# Patient Record
Sex: Male | Born: 1975
Health system: Southern US, Community
[De-identification: ages and names within clinical notes are randomized; demographics above are authoritative.]

## PROBLEM LIST (undated history)

## (undated) DIAGNOSIS — E7800 Pure hypercholesterolemia, unspecified: Secondary | ICD-10-CM

## (undated) DIAGNOSIS — F419 Anxiety disorder, unspecified: Secondary | ICD-10-CM

## (undated) DIAGNOSIS — I10 Essential (primary) hypertension: Secondary | ICD-10-CM

## (undated) DIAGNOSIS — F32A Depression, unspecified: Secondary | ICD-10-CM

## (undated) HISTORY — DX: Anxiety disorder, unspecified: F41.9

## (undated) HISTORY — DX: Depression, unspecified: F32.A

---

## 2004-10-06 ENCOUNTER — Emergency Department (HOSPITAL_COMMUNITY): Admission: EM | Admit: 2004-10-06 | Discharge: 2004-10-06 | Payer: Self-pay | Admitting: Emergency Medicine

## 2007-03-05 ENCOUNTER — Emergency Department (HOSPITAL_COMMUNITY): Admission: EM | Admit: 2007-03-05 | Discharge: 2007-03-05 | Payer: Self-pay | Admitting: Emergency Medicine

## 2013-01-31 ENCOUNTER — Emergency Department (HOSPITAL_COMMUNITY): Payer: Managed Care, Other (non HMO)

## 2013-01-31 ENCOUNTER — Emergency Department (HOSPITAL_COMMUNITY)
Admission: EM | Admit: 2013-01-31 | Discharge: 2013-01-31 | Disposition: A | Discharge status: Home / Self Care | Payer: Managed Care, Other (non HMO) | Attending: Emergency Medicine | Admitting: Emergency Medicine

## 2013-01-31 ENCOUNTER — Encounter (HOSPITAL_COMMUNITY): Payer: Self-pay | Admitting: Emergency Medicine

## 2013-01-31 DIAGNOSIS — E7800 Pure hypercholesterolemia, unspecified: Secondary | ICD-10-CM | POA: Insufficient documentation

## 2013-01-31 DIAGNOSIS — R1011 Right upper quadrant pain: Secondary | ICD-10-CM

## 2013-01-31 DIAGNOSIS — M25519 Pain in unspecified shoulder: Secondary | ICD-10-CM | POA: Insufficient documentation

## 2013-01-31 DIAGNOSIS — R21 Rash and other nonspecific skin eruption: Secondary | ICD-10-CM | POA: Insufficient documentation

## 2013-01-31 DIAGNOSIS — I10 Essential (primary) hypertension: Secondary | ICD-10-CM | POA: Insufficient documentation

## 2013-01-31 DIAGNOSIS — Z79899 Other long term (current) drug therapy: Secondary | ICD-10-CM | POA: Insufficient documentation

## 2013-01-31 DIAGNOSIS — T7840XA Allergy, unspecified, initial encounter: Secondary | ICD-10-CM

## 2013-01-31 DIAGNOSIS — M25511 Pain in right shoulder: Secondary | ICD-10-CM

## 2013-01-31 HISTORY — DX: Essential (primary) hypertension: I10

## 2013-01-31 LAB — COMPREHENSIVE METABOLIC PANEL
ALT: 30 U/L (ref 0–53)
Albumin: 4.1 g/dL (ref 3.5–5.2)
Alkaline Phosphatase: 67 U/L (ref 39–117)
BUN: 11 mg/dL (ref 6–23)
Chloride: 103 mEq/L (ref 96–112)
Creatinine, Ser: 0.85 mg/dL (ref 0.50–1.35)
GFR calc Af Amer: >90 mL/min (ref >90)
GFR calc non Af Amer: >90 mL/min (ref >90)
Glucose, Bld: 91 mg/dL (ref 70–99)
Potassium: 3.5 mEq/L (ref 3.5–5.1)
Sodium: 141 mEq/L (ref 135–145)

## 2013-01-31 LAB — CBC
MCH: 29.3 pg (ref 26.0–34.0)
RDW: 13.2 % (ref 11.5–15.5)

## 2013-01-31 MED ORDER — FAMOTIDINE 20 MG PO TABS: 20.0000 mg | ORAL_TABLET | Freq: Two times a day (BID) | ORAL | Status: DC

## 2013-01-31 MED ORDER — PREDNISONE 20 MG PO TABS: 40.0000 mg | ORAL_TABLET | Freq: Every day | ORAL | Status: DC

## 2013-01-31 MED ORDER — DIPHENHYDRAMINE HCL 50 MG/ML IJ SOLN
25.0000 mg | Freq: Once | INTRAMUSCULAR | Status: AC
  Administered 2013-01-31: 25 mg via INTRAVENOUS
  Filled 2013-01-31: qty 1

## 2013-01-31 MED ORDER — DIPHENHYDRAMINE HCL 25 MG PO TABS: 25.0000 mg | ORAL_TABLET | Freq: Four times a day (QID) | ORAL | Status: DC | PRN

## 2013-01-31 MED ORDER — FAMOTIDINE IN NACL 20-0.9 MG/50ML-% IV SOLN
20.0000 mg | Freq: Once | INTRAVENOUS | Status: AC
  Administered 2013-01-31: 20 mg via INTRAVENOUS
  Filled 2013-01-31: qty 50

## 2013-01-31 MED ORDER — METHYLPREDNISOLONE SODIUM SUCC 125 MG IJ SOLR
125.0000 mg | Freq: Once | INTRAMUSCULAR | Status: AC
  Administered 2013-01-31: 125 mg via INTRAVENOUS
  Filled 2013-01-31: qty 2

## 2013-01-31 NOTE — ED Notes (Signed)
Spoke to Landen in lab, states Lipase can be added on. Label sent to lab.

## 2013-01-31 NOTE — ED Notes (Signed)
Patient denies pain or difficulty breathing. Patient only complaint is "feeling hot". Patient denies any prior allergic reactions.

## 2013-01-31 NOTE — ED Notes (Signed)
Hannah, PA at bedside  

## 2013-01-31 NOTE — ED Provider Notes (Signed)
CSN: [id number]     Arrival date & time 01/31/13  1801 History   First MD Initiated Contact with Patient 01/31/13 1819     Chief Complaint  Patient presents with  . Allergic Reaction   (Consider location/radiation/quality/duration/timing/severity/associated sxs/prior Treatment) The history is provided by the patient and medical records. No language interpreter was used.    Christian Stevens is a 37 y.o. male  with a hx of HTN, high cholesterol presents to the Emergency Department complaining of gradual, persistent, progressively worsening erythematous rash on chest and back onset 3 days ago. Pt reports the rash first started on his chest and was associated with a feeling of indigestion and chest pain. Pt reports the rash has spread to his arms and back.  Pt reports it feels hot, but does not itch.  He denies new foods, lotion, cologne, body wash, shampoo, detergent, foods, or other environmental exposures.  He also denies known insect or tick bites.  Pt reports that when he lays flat he feels as if his chest and abd are stretching too tightly and he thus has not been sleeping.  He has not attempted benadryl or other OTC treatments.  He has no hx of allergic reaction.  Pt denies fever, chills, headache, neck pain, shortness of breath, abdominal pain, nausea, vomiting, diarrhea, weakness, dizziness, syncope, dysuria, hematuria.    Past Medical History  Diagnosis Date  . Hypertension    History reviewed. No pertinent past surgical history. History reviewed. No pertinent family history. History  Substance Use Topics  . Smoking status: Never Smoker   . Smokeless tobacco: Not on file  . Alcohol Use: No    Review of Systems  Constitutional: Negative for fever, diaphoresis, appetite change, fatigue and unexpected weight change.  HENT: Negative for mouth sores.   Eyes: Negative for visual disturbance.  Respiratory: Negative for cough, chest tightness, shortness of breath and wheezing.    Cardiovascular: Negative for chest pain.  Gastrointestinal: Negative for nausea, vomiting, abdominal pain, diarrhea and constipation.  Endocrine: Negative for polydipsia, polyphagia and polyuria.  Genitourinary: Negative for dysuria, urgency, frequency and hematuria.  Musculoskeletal: Negative for back pain and neck stiffness.  Skin: Positive for color change and rash.  Allergic/Immunologic: Negative for immunocompromised state.  Neurological: Negative for syncope, light-headedness and headaches.  Hematological: Does not bruise/bleed easily.  Psychiatric/Behavioral: Negative for sleep disturbance. The patient is not nervous/anxious.     Allergies  Review of patient's allergies indicates no known allergies.  Home Medications   Current Outpatient Rx  Name  Route  Sig  Dispense  Refill  . amLODipine (NORVASC) 2.5 MG tablet   Oral   Take 2.5 mg by mouth daily.         . simvastatin (ZOCOR) 20 MG tablet   Oral   Take 20 mg by mouth daily.         . diphenhydrAMINE (BENADRYL) 25 MG tablet   Oral   Take 1 tablet (25 mg total) by mouth every 6 (six) hours as needed for itching (Rash).   30 tablet   0   . famotidine (PEPCID) 20 MG tablet   Oral   Take 1 tablet (20 mg total) by mouth 2 (two) times daily.   10 tablet   0   . predniSONE (DELTASONE) 20 MG tablet   Oral   Take 2 tablets (40 mg total) by mouth daily.   10 tablet   0    BP 136/80  Pulse 91  Temp(Src) 98.7  F (37.1 C) (Oral)  Resp 24  SpO2 96% Physical Exam  Nursing note and vitals reviewed. Constitutional: He appears well-developed and well-nourished. No distress.  Awake, alert, nontoxic appearance  HENT:  Head: Normocephalic and atraumatic.  Mouth/Throat: Oropharynx is clear and moist. No oropharyngeal exudate.  Eyes: Conjunctivae are normal. No scleral icterus.  Neck: Normal range of motion. Neck supple.  Patent airway, no stridor, handling secretions  Cardiovascular: Normal rate, regular rhythm,  normal heart sounds and intact distal pulses.   No murmur heard. No tachycardia Capillary refill less than 3 seconds  Pulmonary/Chest: Effort normal and breath sounds normal. No respiratory distress. He has no wheezes. He has no rales. He exhibits no tenderness.  No wheezing, no tachypnea  Abdominal: Soft. Bowel sounds are normal. He exhibits no mass. There is no tenderness. There is no rebound and no guarding.  Musculoskeletal: Normal range of motion. He exhibits no edema.  Neurological: He is alert.  Speech is clear and goal oriented Moves extremities without ataxia  Skin: Skin is warm and dry. He is not diaphoretic. There is erythema.  Large, very erythematous rash over the upper arms chest and back resembling a sunburn; no edema, excoriations, induration or evidence of abscess; no target lesions  Psychiatric: He has a normal mood and affect. His behavior is normal.    ED Course  Procedures (including critical care time) Labs Review Labs Reviewed  CBC - Abnormal; Notable for the following:    WBC 10.7 (*)    All other components within normal limits  COMPREHENSIVE METABOLIC PANEL  TROPONIN I  LIPASE, BLOOD   Imaging Review Dg Chest 2 View  01/31/2013   CLINICAL DATA:  Chest pain  EXAM: CHEST  2 VIEW  COMPARISON:  None.  FINDINGS: Normal heart size and vascularity. Slight elevation of the right hemidiaphragm. No focal pneumonia, collapse or consolidation. No effusion or pneumothorax. Trachea midline.  IMPRESSION: No acute chest process   Electronically Signed   By: Ruel Favors M.D.   On: 01/31/2013 18:46   US Abdomen Complete  01/31/2013   CLINICAL DATA:  Right upper quadrant abdominal pain  EXAM: ULTRASOUND ABDOMEN COMPLETE  COMPARISON:  None.  FINDINGS: Limited exam because of bowel gas and patient body habitus.  Gallbladder  Normal wall thickness measuring 1.7 mm. No Murphy's sign. No definite gallstones. Small incidental gallbladder polyps noted measuring 5 mm or less.  Common  bile duct  Diameter: 4.8 mm diameter. No dilatation or obstruction  Liver  Diffuse heterogeneous increased echotexture, suspect fatty infiltration. No focal abnormality or biliary dilatation. Patent portal vein with normal hepatopetal flow.  IVC  Not visualized  Pancreas  Nonvisualized  Spleen  Size and appearance within normal limits.  Right Kidney  Length: 11.5 cm. Echogenicity within normal limits. No mass or hydronephrosis visualized.  Left Kidney  Length: 12.2 cm. Echogenicity within normal limits. No mass or hydronephrosis visualized.  Abdominal aorta  Nonvisualized  IMPRESSION: Limited because of bowel gas and body habitus  No definite acute finding by ultrasound.  Negative for gallstones or cholecystitis  No biliary dilatation   Electronically Signed   By: Ruel Favors M.D.   On: 01/31/2013 21:58    EKG Interpretation     Ventricular Rate:  90 PR Interval:  146 QRS Duration: 98 QT Interval:  366 QTC Calculation: 448 R Axis:   -175 Text Interpretation:  Right and left arm electrode reversal, interpretation assumes no reversal Sinus or ectopic atrial rhythm Right axis deviation  Probable anteroseptal infarct, old Abnormal T, consider ischemia, lateral leads            MDM   1. Allergic reaction, initial encounter   2. RUQ abdominal pain   3. Right shoulder pain      Christian Stevens presents with rash, CP and RUQ abd pain and right shoulder pain.  Patient was seen by his primary care earlier today and sent to the emergency department.  Patient with erythematous and very warm rash to the upper extremities and upper trunk.  Patient Christian Stevens is a ptosis but otherwise normal labs. Negative troponin and unconcerning ECG.  Patient given IV Benadryl, Solu-Medrol and Pepcid with resolution of rash and hot feeling. Patient also reports resolution of his chest pain after treatment. Patient with persistent right upper quadrant abdominal pain and right shoulder pain. Concern for  cholecystitis will evaluate with ultrasound.  Ultrasound without evidence of gallstones or cholecystitis. Chest x-ray without evidence of pneumonia or pulmonary edema.  I personally reviewed the imaging tests through PACS system.  I reviewed available ER/hospitalization records through the EMR.  Patient resting comfortably and no emesis in the department. We'll discharge home with allergic reaction and PUD treatment as well as close followup with his primary care physician.  Patient re-evaluated prior to dc, is hemodynamically stable, in no respiratory distress, and denies the feeling of throat closing. Pt has been advised to take OTC benadryl & return to the ED if they have a mod-severe allergic rxn (s/s including throat closing, difficulty breathing, swelling of lips face or tongue).  It has been determined that no acute conditions requiring further emergency intervention are present at this time. The patient/guardian have been advised of the diagnosis and plan. We have discussed signs and symptoms that warrant return to the ED, such as changes or worsening in symptoms.   Vital signs are stable at discharge.   BP 136/80  Pulse 91  Temp(Src) 98.7 F (37.1 C) (Oral)  Resp 24  SpO2 96%  Patient/guardian has voiced understanding and agreed to follow-up with the PCP or specialist.         Dierdre Forth, PA-C 02/01/13 0147

## 2013-01-31 NOTE — ED Notes (Signed)
Pt presents to ed with c/o allergic reaction. He was seen by his PCP and sent to ed. Pt sts chest pain, abdominal pain as well. Pt has redness and warmth noted to his upper body (chest, back and b/l arms)

## 2013-01-31 NOTE — ED Notes (Signed)
Bed: WA03 Expected date:  Expected time:  Means of arrival:  Comments: Hold for [place]

## 2013-02-03 NOTE — ED Provider Notes (Signed)
Medical screening examination/treatment/procedure(s) were performed by non-physician practitioner and as supervising physician I was immediately available for consultation/collaboration.  EKG Interpretation   None        Derwood Kaplan, MD 02/03/13 1630

## 2013-02-07 ENCOUNTER — Other Ambulatory Visit: Payer: Self-pay | Admitting: Internal Medicine

## 2013-02-07 ENCOUNTER — Other Ambulatory Visit: Payer: Self-pay

## 2013-02-07 DIAGNOSIS — R1011 Right upper quadrant pain: Secondary | ICD-10-CM

## 2013-02-07 DIAGNOSIS — R52 Pain, unspecified: Secondary | ICD-10-CM

## 2013-02-08 ENCOUNTER — Inpatient Hospital Stay: Admission: RE | Admit: 2013-02-08 | Payer: Managed Care, Other (non HMO) | Source: Ambulatory Visit

## 2014-01-10 ENCOUNTER — Other Ambulatory Visit (HOSPITAL_COMMUNITY): Payer: Self-pay | Admitting: Internal Medicine

## 2014-01-10 DIAGNOSIS — R1011 Right upper quadrant pain: Secondary | ICD-10-CM

## 2014-01-24 ENCOUNTER — Ambulatory Visit (HOSPITAL_COMMUNITY): Payer: Managed Care, Other (non HMO)

## 2014-02-01 ENCOUNTER — Encounter (HOSPITAL_COMMUNITY)
Admission: RE | Admit: 2014-02-01 | Discharge: 2014-02-01 | Disposition: A | Discharge status: Home / Self Care | Payer: Managed Care, Other (non HMO) | Source: Ambulatory Visit | Attending: Internal Medicine | Admitting: Internal Medicine

## 2014-02-01 DIAGNOSIS — R1011 Right upper quadrant pain: Secondary | ICD-10-CM

## 2014-02-01 MED ORDER — SINCALIDE 5 MCG IJ SOLR
0.0200 ug/kg | Freq: Once | INTRAMUSCULAR | Status: AC
  Administered 2014-02-01: 2.81 ug via INTRAVENOUS

## 2014-02-01 MED ORDER — SINCALIDE 5 MCG IJ SOLR
INTRAMUSCULAR | Status: AC
  Administered 2014-02-01: 2.81 ug via INTRAVENOUS
  Filled 2014-02-01: qty 10

## 2014-02-01 MED ORDER — TECHNETIUM TC 99M MEBROFENIN IV KIT
5.0000 | PACK | Freq: Once | INTRAVENOUS | Status: AC | PRN
  Administered 2014-02-01: 5 via INTRAVENOUS

## 2014-02-01 MED ORDER — STERILE WATER FOR INJECTION IJ SOLN
INTRAMUSCULAR | Status: AC
  Filled 2014-02-01: qty 10

## 2016-11-09 ENCOUNTER — Telehealth: Payer: Self-pay | Admitting: General Practice

## 2016-11-09 NOTE — Telephone Encounter (Signed)
Left message to sched new pt appt.   -LL

## 2016-12-30 ENCOUNTER — Other Ambulatory Visit: Payer: Self-pay | Admitting: Podiatry

## 2016-12-30 ENCOUNTER — Encounter: Payer: Self-pay | Admitting: Podiatry

## 2016-12-30 ENCOUNTER — Ambulatory Visit (INDEPENDENT_AMBULATORY_CARE_PROVIDER_SITE_OTHER): Payer: Managed Care, Other (non HMO)

## 2016-12-30 ENCOUNTER — Ambulatory Visit (INDEPENDENT_AMBULATORY_CARE_PROVIDER_SITE_OTHER): Payer: Managed Care, Other (non HMO) | Admitting: Podiatry

## 2016-12-30 VITALS — BP 145/83 | HR 101 | Resp 16

## 2016-12-30 DIAGNOSIS — M79672 Pain in left foot: Secondary | ICD-10-CM | POA: Diagnosis not present

## 2016-12-30 DIAGNOSIS — M779 Enthesopathy, unspecified: Secondary | ICD-10-CM | POA: Diagnosis not present

## 2016-12-30 DIAGNOSIS — M7660 Achilles tendinitis, unspecified leg: Secondary | ICD-10-CM

## 2016-12-30 DIAGNOSIS — M79671 Pain in right foot: Secondary | ICD-10-CM

## 2016-12-30 MED ORDER — PREDNISONE 10 MG PO TABS: ORAL_TABLET | ORAL | 0 refills | Status: DC

## 2016-12-30 MED ORDER — TRIAMCINOLONE ACETONIDE 10 MG/ML IJ SUSP
10.0000 mg | Freq: Once | INTRAMUSCULAR | Status: AC
  Administered 2016-12-30: 10 mg

## 2016-12-30 NOTE — Patient Instructions (Signed)
Achilles Tendinitis  with Rehab Achilles tendinitis is a disorder of the Achilles tendon. The Achilles tendon connects the large calf muscles (Gastrocnemius and Soleus) to the heel bone (calcaneus). This tendon is sometimes called the heel cord. It is important for pushing-off and standing on your toes and is important for walking, running, or jumping. Tendinitis is often caused by overuse and repetitive microtrauma. SYMPTOMS  Pain, tenderness, swelling, warmth, and redness may occur over the Achilles tendon even at rest.  Pain with pushing off, or flexing or extending the ankle.  Pain that is worsened after or during activity. CAUSES   Overuse sometimes seen with rapid increase in exercise programs or in sports requiring running and jumping.  Poor physical conditioning (strength and flexibility or endurance).  Running sports, especially training running down hills.  Inadequate warm-up before practice or play or failure to stretch before participation.  Injury to the tendon. PREVENTION   Warm up and stretch before practice or competition.  Allow time for adequate rest and recovery between practices and competition.  Keep up conditioning.  Keep up ankle and leg flexibility.  Improve or keep muscle strength and endurance.  Improve cardiovascular fitness.  Use proper technique.  Use proper equipment (shoes, skates).  To help prevent recurrence, taping, protective strapping, or an adhesive bandage may be recommended for several weeks after healing is complete. PROGNOSIS   Recovery may take weeks to several months to heal.  Longer recovery is expected if symptoms have been prolonged.  Recovery is usually quicker if the inflammation is due to a direct blow as compared with overuse or sudden strain. RELATED COMPLICATIONS   Healing time will be prolonged if the condition is not correctly treated. The injury must be given plenty of time to heal.  Symptoms can reoccur if  activity is resumed too soon.  Untreated, tendinitis may increase the risk of tendon rupture requiring additional time for recovery and possibly surgery. TREATMENT   The first treatment consists of rest anti-inflammatory medication, and ice to relieve the pain.  Stretching and strengthening exercises after resolution of pain will likely help reduce the risk of recurrence. Referral to a physical therapist or athletic trainer for further evaluation and treatment may be helpful.  A walking boot or cast may be recommended to rest the Achilles tendon. This can help break the cycle of inflammation and microtrauma.  Arch supports (orthotics) may be prescribed or recommended by your caregiver as an adjunct to therapy and rest.  Surgery to remove the inflamed tendon lining or degenerated tendon tissue is rarely necessary and has shown less than predictable results. MEDICATION   Nonsteroidal anti-inflammatory medications, such as aspirin and ibuprofen, may be used for pain and inflammation relief. Do not take within 7 days before surgery. Take these as directed by your caregiver. Contact your caregiver immediately if any bleeding, stomach upset, or signs of allergic reaction occur. Other minor pain relievers, such as acetaminophen, may also be used.  Pain relievers may be prescribed as necessary by your caregiver. Do not take prescription pain medication for longer than 4 to 7 days. Use only as directed and only as much as you need.  Cortisone injections are rarely indicated. Cortisone injections may weaken tendons and predispose to rupture. It is better to give the condition more time to heal than to use them. HEAT AND COLD  Cold is used to relieve pain and reduce inflammation for acute and chronic Achilles tendinitis. Cold should be applied for 10 to 15 minutes   every 2 to 3 hours for inflammation and pain and immediately after any activity that aggravates your symptoms. Use ice packs or an ice  massage.  Heat may be used before performing stretching and strengthening activities prescribed by your caregiver. Use a heat pack or a warm soak. SEEK MEDICAL CARE IF:  Symptoms get worse or do not improve in 2 weeks despite treatment.  New, unexplained symptoms develop. Drugs used in treatment may produce side effects.  EXERCISES:  RANGE OF MOTION (ROM) AND STRETCHING EXERCISES - Achilles Tendinitis  These exercises may help you when beginning to rehabilitate your injury. Your symptoms may resolve with or without further involvement from your physician, physical therapist or athletic trainer. While completing these exercises, remember:   Restoring tissue flexibility helps normal motion to return to the joints. This allows healthier, less painful movement and activity.  An effective stretch should be held for at least 30 seconds.  A stretch should never be painful. You should only feel a gentle lengthening or release in the stretched tissue.  STRETCH  Gastroc, Standing   Place hands on wall.  Extend right / left leg, keeping the front knee somewhat bent.  Slightly point your toes inward on your back foot.  Keeping your right / left heel on the floor and your knee straight, shift your weight toward the wall, not allowing your back to arch.  You should feel a gentle stretch in the right / left calf. Hold this position for 10 seconds. Repeat 3 times. Complete this stretch 2 times per day.  STRETCH  Soleus, Standing   Place hands on wall.  Extend right / left leg, keeping the other knee somewhat bent.  Slightly point your toes inward on your back foot.  Keep your right / left heel on the floor, bend your back knee, and slightly shift your weight over the back leg so that you feel a gentle stretch deep in your back calf.  Hold this position for 10 seconds. Repeat 3 times. Complete this stretch 2 times per day.  STRETCH  Gastrocsoleus, Standing  Note: This exercise can place  a lot of stress on your foot and ankle. Please complete this exercise only if specifically instructed by your caregiver.   Place the ball of your right / left foot on a step, keeping your other foot firmly on the same step.  Hold on to the wall or a rail for balance.  Slowly lift your other foot, allowing your body weight to press your heel down over the edge of the step.  You should feel a stretch in your right / left calf.  Hold this position for 10 seconds.  Repeat this exercise with a slight bend in your knee. Repeat 3 times. Complete this stretch 2 times per day.   STRENGTHENING EXERCISES - Achilles Tendinitis These exercises may help you when beginning to rehabilitate your injury. They may resolve your symptoms with or without further involvement from your physician, physical therapist or athletic trainer. While completing these exercises, remember:   Muscles can gain both the endurance and the strength needed for everyday activities through controlled exercises.  Complete these exercises as instructed by your physician, physical therapist or athletic trainer. Progress the resistance and repetitions only as guided.  You may experience muscle soreness or fatigue, but the pain or discomfort you are trying to eliminate should never worsen during these exercises. If this pain does worsen, stop and make certain you are following the directions exactly. If   the pain is still present after adjustments, discontinue the exercise until you can discuss the trouble with your clinician.  STRENGTH - Plantar-flexors   Sit with your right / left leg extended. Holding onto both ends of a rubber exercise band/tubing, loop it around the ball of your foot. Keep a slight tension in the band.  Slowly push your toes away from you, pointing them downward.  Hold this position for 10 seconds. Return slowly, controlling the tension in the band/tubing. Repeat 3 times. Complete this exercise 2 times per day.     STRENGTH - Plantar-flexors   Stand with your feet shoulder width apart. Steady yourself with a wall or table using as little support as needed.  Keeping your weight evenly spread over the width of your feet, rise up on your toes.*  Hold this position for 10 seconds. Repeat 3 times. Complete this exercise 2 times per day.  *If this is too easy, shift your weight toward your right / left leg until you feel challenged. Ultimately, you may be asked to do this exercise with your right / left foot only.  STRENGTH  Plantar-flexors, Eccentric  Note: This exercise can place a lot of stress on your foot and ankle. Please complete this exercise only if specifically instructed by your caregiver.   Place the balls of your feet on a step. With your hands, use only enough support from a wall or rail to keep your balance.  Keep your knees straight and rise up on your toes.  Slowly shift your weight entirely to your right / left toes and pick up your opposite foot. Gently and with controlled movement, lower your weight through your right / left foot so that your heel drops below the level of the step. You will feel a slight stretch in the back of your calf at the end position.  Use the healthy leg to help rise up onto the balls of both feet, then lower weight only on the right / left leg again. Build up to 15 repetitions. Then progress to 3 consecutive sets of 15 repetitions.*  After completing the above exercise, complete the same exercise with a slight knee bend (about 30 degrees). Again, build up to 15 repetitions. Then progress to 3 consecutive sets of 15 repetitions.* Perform this exercise 2 times per day.  *When you easily complete 3 sets of 15, your physician, physical therapist or athletic trainer may advise you to add resistance by wearing a backpack filled with additional weight.  STRENGTH - Plantar Flexors, Seated   Sit on a chair that allows your feet to rest flat on the ground. If  necessary, sit at the edge of the chair.  Keeping your toes firmly on the ground, lift your right / left heel as far as you can without increasing any discomfort in your ankle. Repeat 3 times. Complete this exercise 2 times a day. Achilles Tendinitis  with Rehab Achilles tendinitis is a disorder of the Achilles tendon. The Achilles tendon connects the large calf muscles (Gastrocnemius and Soleus) to the heel bone (calcaneus). This tendon is sometimes called the heel cord. It is important for pushing-off and standing on your toes and is important for walking, running, or jumping. Tendinitis is often caused by overuse and repetitive microtrauma. SYMPTOMS  Pain, tenderness, swelling, warmth, and redness may occur over the Achilles tendon even at rest.  Pain with pushing off, or flexing or extending the ankle.  Pain that is worsened after or during activity. CAUSES     Overuse sometimes seen with rapid increase in exercise programs or in sports requiring running and jumping.  Poor physical conditioning (strength and flexibility or endurance).  Running sports, especially training running down hills.  Inadequate warm-up before practice or play or failure to stretch before participation.  Injury to the tendon. PREVENTION   Warm up and stretch before practice or competition.  Allow time for adequate rest and recovery between practices and competition.  Keep up conditioning.  Keep up ankle and leg flexibility.  Improve or keep muscle strength and endurance.  Improve cardiovascular fitness.  Use proper technique.  Use proper equipment (shoes, skates).  To help prevent recurrence, taping, protective strapping, or an adhesive bandage may be recommended for several weeks after healing is complete. PROGNOSIS   Recovery may take weeks to several months to heal.  Longer recovery is expected if symptoms have been prolonged.  Recovery is usually quicker if the inflammation is due to a  direct blow as compared with overuse or sudden strain. RELATED COMPLICATIONS   Healing time will be prolonged if the condition is not correctly treated. The injury must be given plenty of time to heal.  Symptoms can reoccur if activity is resumed too soon.  Untreated, tendinitis may increase the risk of tendon rupture requiring additional time for recovery and possibly surgery. TREATMENT   The first treatment consists of rest anti-inflammatory medication, and ice to relieve the pain.  Stretching and strengthening exercises after resolution of pain will likely help reduce the risk of recurrence. Referral to a physical therapist or athletic trainer for further evaluation and treatment may be helpful.  A walking boot or cast may be recommended to rest the Achilles tendon. This can help break the cycle of inflammation and microtrauma.  Arch supports (orthotics) may be prescribed or recommended by your caregiver as an adjunct to therapy and rest.  Surgery to remove the inflamed tendon lining or degenerated tendon tissue is rarely necessary and has shown less than predictable results. MEDICATION   Nonsteroidal anti-inflammatory medications, such as aspirin and ibuprofen, may be used for pain and inflammation relief. Do not take within 7 days before surgery. Take these as directed by your caregiver. Contact your caregiver immediately if any bleeding, stomach upset, or signs of allergic reaction occur. Other minor pain relievers, such as acetaminophen, may also be used.  Pain relievers may be prescribed as necessary by your caregiver. Do not take prescription pain medication for longer than 4 to 7 days. Use only as directed and only as much as you need.  Cortisone injections are rarely indicated. Cortisone injections may weaken tendons and predispose to rupture. It is better to give the condition more time to heal than to use them. HEAT AND COLD  Cold is used to relieve pain and reduce  inflammation for acute and chronic Achilles tendinitis. Cold should be applied for 10 to 15 minutes every 2 to 3 hours for inflammation and pain and immediately after any activity that aggravates your symptoms. Use ice packs or an ice massage.  Heat may be used before performing stretching and strengthening activities prescribed by your caregiver. Use a heat pack or a warm soak. SEEK MEDICAL CARE IF:  Symptoms get worse or do not improve in 2 weeks despite treatment.  New, unexplained symptoms develop. Drugs used in treatment may produce side effects.  EXERCISES:  RANGE OF MOTION (ROM) AND STRETCHING EXERCISES - Achilles Tendinitis  These exercises may help you when beginning to rehabilitate your injury. Your   symptoms may resolve with or without further involvement from your physician, physical therapist or athletic trainer. While completing these exercises, remember:   Restoring tissue flexibility helps normal motion to return to the joints. This allows healthier, less painful movement and activity.  An effective stretch should be held for at least 30 seconds.  A stretch should never be painful. You should only feel a gentle lengthening or release in the stretched tissue.  STRETCH  Gastroc, Standing   Place hands on wall.  Extend right / left leg, keeping the front knee somewhat bent.  Slightly point your toes inward on your back foot.  Keeping your right / left heel on the floor and your knee straight, shift your weight toward the wall, not allowing your back to arch.  You should feel a gentle stretch in the right / left calf. Hold this position for 10 seconds. Repeat 3 times. Complete this stretch 2 times per day.  STRETCH  Soleus, Standing   Place hands on wall.  Extend right / left leg, keeping the other knee somewhat bent.  Slightly point your toes inward on your back foot.  Keep your right / left heel on the floor, bend your back knee, and slightly shift your weight  over the back leg so that you feel a gentle stretch deep in your back calf.  Hold this position for 10 seconds. Repeat 3 times. Complete this stretch 2 times per day.  STRETCH  Gastrocsoleus, Standing  Note: This exercise can place a lot of stress on your foot and ankle. Please complete this exercise only if specifically instructed by your caregiver.   Place the ball of your right / left foot on a step, keeping your other foot firmly on the same step.  Hold on to the wall or a rail for balance.  Slowly lift your other foot, allowing your body weight to press your heel down over the edge of the step.  You should feel a stretch in your right / left calf.  Hold this position for 10 seconds.  Repeat this exercise with a slight bend in your knee. Repeat 3 times. Complete this stretch 2 times per day.   STRENGTHENING EXERCISES - Achilles Tendinitis These exercises may help you when beginning to rehabilitate your injury. They may resolve your symptoms with or without further involvement from your physician, physical therapist or athletic trainer. While completing these exercises, remember:   Muscles can gain both the endurance and the strength needed for everyday activities through controlled exercises.  Complete these exercises as instructed by your physician, physical therapist or athletic trainer. Progress the resistance and repetitions only as guided.  You may experience muscle soreness or fatigue, but the pain or discomfort you are trying to eliminate should never worsen during these exercises. If this pain does worsen, stop and make certain you are following the directions exactly. If the pain is still present after adjustments, discontinue the exercise until you can discuss the trouble with your clinician.  STRENGTH - Plantar-flexors   Sit with your right / left leg extended. Holding onto both ends of a rubber exercise band/tubing, loop it around the ball of your foot. Keep a slight  tension in the band.  Slowly push your toes away from you, pointing them downward.  Hold this position for 10 seconds. Return slowly, controlling the tension in the band/tubing. Repeat 3 times. Complete this exercise 2 times per day.   STRENGTH - Plantar-flexors   Stand with your feet   shoulder width apart. Steady yourself with a wall or table using as little support as needed.  Keeping your weight evenly spread over the width of your feet, rise up on your toes.*  Hold this position for 10 seconds. Repeat 3 times. Complete this exercise 2 times per day.  *If this is too easy, shift your weight toward your right / left leg until you feel challenged. Ultimately, you may be asked to do this exercise with your right / left foot only.  STRENGTH  Plantar-flexors, Eccentric  Note: This exercise can place a lot of stress on your foot and ankle. Please complete this exercise only if specifically instructed by your caregiver.   Place the balls of your feet on a step. With your hands, use only enough support from a wall or rail to keep your balance.  Keep your knees straight and rise up on your toes.  Slowly shift your weight entirely to your right / left toes and pick up your opposite foot. Gently and with controlled movement, lower your weight through your right / left foot so that your heel drops below the level of the step. You will feel a slight stretch in the back of your calf at the end position.  Use the healthy leg to help rise up onto the balls of both feet, then lower weight only on the right / left leg again. Build up to 15 repetitions. Then progress to 3 consecutive sets of 15 repetitions.*  After completing the above exercise, complete the same exercise with a slight knee bend (about 30 degrees). Again, build up to 15 repetitions. Then progress to 3 consecutive sets of 15 repetitions.* Perform this exercise 2 times per day.  *When you easily complete 3 sets of 15, your physician,  physical therapist or athletic trainer may advise you to add resistance by wearing a backpack filled with additional weight.  STRENGTH - Plantar Flexors, Seated   Sit on a chair that allows your feet to rest flat on the ground. If necessary, sit at the edge of the chair.  Keeping your toes firmly on the ground, lift your right / left heel as far as you can without increasing any discomfort in your ankle. Repeat 3 times. Complete this exercise 2 times a day.  

## 2016-12-30 NOTE — Progress Notes (Signed)
Subjective:    Patient ID: Christian Stevens, male   DOB: 41 y.o.   MRN: [id number]   HPI patient presents with caregiver with pain in the back of the heel right over left with inflammation and problems with ambulation. States it's been getting gradually worse and making it hard to walk comfortably. Patient does not remember specific injury and states it's been present for at least 3 months and patient does not smoke and never had    Review of Systems  All other systems reviewed and are negative.       Objective:  Physical Exam  Constitutional: He appears well-developed and well-nourished.  Cardiovascular: Intact distal pulses.   Pulmonary/Chest: Effort normal.  Musculoskeletal: Normal range of motion.  Neurological: He is alert.  Nursing note and vitals reviewed.  neurovascular status intact muscle strength adequate range of motion within normal limits with patient noted to have inflammation pain in the posterior aspect of the heel right over left with fluid buildup mostly on the medial side of the posterior Achilles tendon. There is warmth in the area and there is no problems or pathology of the musculotendinous junction. Patient's noted good digital perfusion is well oriented 3     Assessment:  Inflammatory Achilles tendinitis bilateral right over left with inflammation mostly on the medial side       Plan:    H&P condition reviewed and discussed treatment options. We'll get a take an aggressive conservative approach and I did explain chances for rupture associated with this and is willing to accept risk. I carefully injected on the right one 3 mg dexamethasone Kenalog 5 mill grams Xylocaine into the medial side of the Achilles I instructed on physical therapy and dispensed air fracture walker for complete immobilization. Placed on a 12 a steroid pack and reappoint again in the next 3-4 weeks and may wear the brace on the left after 2-3 weeks if symptoms on the right have  resolved  X-rays indicate that there is a large posterior spur formation right over left with plantar spur formation right

## 2017-01-27 ENCOUNTER — Ambulatory Visit (INDEPENDENT_AMBULATORY_CARE_PROVIDER_SITE_OTHER): Payer: Managed Care, Other (non HMO) | Admitting: Podiatry

## 2017-01-27 DIAGNOSIS — M7660 Achilles tendinitis, unspecified leg: Secondary | ICD-10-CM

## 2017-01-27 MED ORDER — TRIAMCINOLONE ACETONIDE 10 MG/ML IJ SUSP
10.0000 mg | Freq: Once | INTRAMUSCULAR | Status: AC
  Administered 2017-01-27: 10 mg

## 2017-01-27 MED ORDER — DICLOFENAC SODIUM 75 MG PO TBEC: 75.0000 mg | DELAYED_RELEASE_TABLET | Freq: Two times a day (BID) | ORAL | 2 refills | Status: DC

## 2017-01-27 NOTE — Progress Notes (Signed)
Subjective:    Patient ID: Christian Stevens, male   DOB: 41 y.o.   MRN: [id number]   HPI patient states the right heel is doing quite a bit better with the injection was done but now the left one is really hurting me and my right one still hurts at the end of the day as I'm on my foot all day at work    ROS      Objective:  Physical Exam neurovascular status intact muscle strength adequate with patient's posterior left heel been very tender in the medial and central portion and the right heel being mildly tender at the insertion of the Achilles into the calcaneus. Patient stated the boot was helpful     Assessment:    Doing better with Achilles tendinitis with quite a bit of pain on the left posterior heel medial side with the right showing improvement     Plan:   H&P condition reviewed and today I explained risk of injecting the left one and he wants it done and I injected the left medial and central heel 3 mg dexamethasone Kenalog 5 mill grams Xylocaine and we will utilize a air fracture walker on the left to protect it and we will start night splinting on the right with aggressive ice therapy. Also begin diclofenac 75 mg twice a day reappoint 4 weeks

## 2017-02-24 ENCOUNTER — Encounter: Payer: Self-pay | Admitting: Podiatry

## 2017-02-24 ENCOUNTER — Ambulatory Visit: Payer: Managed Care, Other (non HMO) | Admitting: Podiatry

## 2017-02-24 DIAGNOSIS — M7660 Achilles tendinitis, unspecified leg: Secondary | ICD-10-CM

## 2017-02-24 MED ORDER — TRIAMCINOLONE ACETONIDE 10 MG/ML IJ SUSP
10.0000 mg | Freq: Once | INTRAMUSCULAR | Status: AC
  Administered 2017-02-24: 10 mg

## 2017-02-24 NOTE — Progress Notes (Signed)
Subjective:   Patient ID: Christian Stevens, male   DOB: 41 y.o.   MRN: [id number]   HPI Patient presents stating that his heel on the left continues to bother him but it appears to be in a different area and he states that the right has been doing very well.   ROS      Objective:  Physical Exam  Patient's pain is no longer on the medial side of the posterior heel but is more on the lateral side of the posterior heel with inflammation and no indication that there is medial pain at the current time with the right posterior heel doing well.       Assessment:  Patient appears to be doing well right with a different pain with the posterior lateral left heel still inflamed and painful in the medial side doing well.     Plan:  At this time I went ahead and I recommended careful injection of the lateral side explaining chance for rupture but we will not be putting in the same area as previously.  This was accomplished with 3 mg dexamethasone Kenalog 5 mg Xylocaine after patient was made aware and gave approval.  I did go ahead today and dispensed a silicone Achilles tendon sleeve to take pressure off the posterior heel and want him to wear his boot full-time for the next 2 weeks and he will be reevaluated at that time.

## 2017-03-10 ENCOUNTER — Encounter: Payer: Self-pay | Admitting: Podiatry

## 2017-03-10 ENCOUNTER — Ambulatory Visit: Payer: Managed Care, Other (non HMO) | Admitting: Podiatry

## 2017-03-10 DIAGNOSIS — M7660 Achilles tendinitis, unspecified leg: Secondary | ICD-10-CM | POA: Diagnosis not present

## 2017-03-10 DIAGNOSIS — M79671 Pain in right foot: Secondary | ICD-10-CM

## 2017-03-10 DIAGNOSIS — M79672 Pain in left foot: Secondary | ICD-10-CM

## 2017-03-11 NOTE — Progress Notes (Signed)
Subjective:   Patient ID: Christian Stevens, male   DOB: 41 y.o.   MRN: [id number]   HPI Patient presents with caregiver stating that his heels are feeling quite a bit better but he has had a chronic history of condition.   ROS      Objective:  Physical Exam  Neurovascular status intact with patient found to have posterior inflammation that is improving but still present with significant equinus condition and moderate flatfoot condition.     Assessment:  Acute Achilles tendinitis noted with flatfoot condition and equinus as complicating factors.     Plan:  Discussed the importance of continued anti-inflammatories physical therapy and stretching exercises.  Scanned for custom orthotics with quarter-inch heel lifts in order to take stress off the Achilles and reduce the plantar stress that is occurring against the feet.  Reappoint when ready

## 2017-04-04 ENCOUNTER — Other Ambulatory Visit: Payer: Managed Care, Other (non HMO) | Admitting: Orthotics

## 2017-04-18 ENCOUNTER — Ambulatory Visit (INDEPENDENT_AMBULATORY_CARE_PROVIDER_SITE_OTHER): Payer: Managed Care, Other (non HMO) | Admitting: Orthotics

## 2017-04-18 DIAGNOSIS — M7660 Achilles tendinitis, unspecified leg: Secondary | ICD-10-CM

## 2017-05-22 NOTE — Progress Notes (Signed)
Patient came in today to pick up custom made foot orthotics.  The goals were accomplished and the patient reported no dissatisfaction with said orthotics.  Patient was advised of breakin period and how to report any issues. 

## 2019-10-13 ENCOUNTER — Emergency Department (HOSPITAL_BASED_OUTPATIENT_CLINIC_OR_DEPARTMENT_OTHER): Payer: Managed Care, Other (non HMO)

## 2019-10-13 ENCOUNTER — Inpatient Hospital Stay (HOSPITAL_BASED_OUTPATIENT_CLINIC_OR_DEPARTMENT_OTHER)
Admission: EM | Admit: 2019-10-13 | Discharge: 2019-10-17 | DRG: 982 | Disposition: A | Discharge status: Home / Self Care | Payer: Managed Care, Other (non HMO) | Attending: Internal Medicine | Admitting: Internal Medicine

## 2019-10-13 ENCOUNTER — Other Ambulatory Visit: Payer: Self-pay

## 2019-10-13 ENCOUNTER — Encounter (HOSPITAL_BASED_OUTPATIENT_CLINIC_OR_DEPARTMENT_OTHER): Payer: Self-pay

## 2019-10-13 DIAGNOSIS — Z888 Allergy status to other drugs, medicaments and biological substances status: Secondary | ICD-10-CM

## 2019-10-13 DIAGNOSIS — I82412 Acute embolism and thrombosis of left femoral vein: Secondary | ICD-10-CM | POA: Diagnosis present

## 2019-10-13 DIAGNOSIS — I82422 Acute embolism and thrombosis of left iliac vein: Secondary | ICD-10-CM | POA: Diagnosis present

## 2019-10-13 DIAGNOSIS — I871 Compression of vein: Secondary | ICD-10-CM | POA: Diagnosis present

## 2019-10-13 DIAGNOSIS — I82442 Acute embolism and thrombosis of left tibial vein: Secondary | ICD-10-CM | POA: Diagnosis present

## 2019-10-13 DIAGNOSIS — Z8249 Family history of ischemic heart disease and other diseases of the circulatory system: Secondary | ICD-10-CM

## 2019-10-13 DIAGNOSIS — I2699 Other pulmonary embolism without acute cor pulmonale: Secondary | ICD-10-CM | POA: Diagnosis not present

## 2019-10-13 DIAGNOSIS — R600 Localized edema: Secondary | ICD-10-CM | POA: Diagnosis present

## 2019-10-13 DIAGNOSIS — R7989 Other specified abnormal findings of blood chemistry: Secondary | ICD-10-CM | POA: Diagnosis present

## 2019-10-13 DIAGNOSIS — I82432 Acute embolism and thrombosis of left popliteal vein: Secondary | ICD-10-CM | POA: Diagnosis present

## 2019-10-13 DIAGNOSIS — Z20822 Contact with and (suspected) exposure to covid-19: Secondary | ICD-10-CM | POA: Diagnosis present

## 2019-10-13 DIAGNOSIS — Z79899 Other long term (current) drug therapy: Secondary | ICD-10-CM

## 2019-10-13 DIAGNOSIS — Z6841 Body Mass Index (BMI) 40.0 and over, adult: Secondary | ICD-10-CM

## 2019-10-13 DIAGNOSIS — R0902 Hypoxemia: Secondary | ICD-10-CM | POA: Diagnosis present

## 2019-10-13 DIAGNOSIS — M7989 Other specified soft tissue disorders: Secondary | ICD-10-CM | POA: Diagnosis present

## 2019-10-13 DIAGNOSIS — I10 Essential (primary) hypertension: Secondary | ICD-10-CM | POA: Diagnosis present

## 2019-10-13 DIAGNOSIS — E782 Mixed hyperlipidemia: Secondary | ICD-10-CM | POA: Diagnosis present

## 2019-10-13 DIAGNOSIS — Z7901 Long term (current) use of anticoagulants: Secondary | ICD-10-CM

## 2019-10-13 DIAGNOSIS — Z83438 Family history of other disorder of lipoprotein metabolism and other lipidemia: Secondary | ICD-10-CM

## 2019-10-13 DIAGNOSIS — K219 Gastro-esophageal reflux disease without esophagitis: Secondary | ICD-10-CM | POA: Diagnosis present

## 2019-10-13 DIAGNOSIS — I248 Other forms of acute ischemic heart disease: Secondary | ICD-10-CM | POA: Diagnosis present

## 2019-10-13 DIAGNOSIS — R778 Other specified abnormalities of plasma proteins: Secondary | ICD-10-CM | POA: Diagnosis present

## 2019-10-13 DIAGNOSIS — R739 Hyperglycemia, unspecified: Secondary | ICD-10-CM | POA: Diagnosis present

## 2019-10-13 DIAGNOSIS — Z7952 Long term (current) use of systemic steroids: Secondary | ICD-10-CM

## 2019-10-13 DIAGNOSIS — I824Z2 Acute embolism and thrombosis of unspecified deep veins of left distal lower extremity: Secondary | ICD-10-CM

## 2019-10-13 HISTORY — DX: Pure hypercholesterolemia, unspecified: E78.00

## 2019-10-13 LAB — CBC WITH DIFFERENTIAL/PLATELET
Abs Immature Granulocytes: 0.08 10*3/uL — ABNORMAL HIGH (ref 0.00–0.07)
Basophils Absolute: 0.1 10*3/uL (ref 0.0–0.1)
Basophils Relative: 1 %
Eosinophils Absolute: 0.1 10*3/uL (ref 0.0–0.5)
Eosinophils Relative: 1 %
HCT: 44.6 % (ref 39.0–52.0)
Hemoglobin: 14.9 g/dL (ref 13.0–17.0)
Immature Granulocytes: 1 %
Lymphocytes Relative: 44 %
Lymphs Abs: 4.9 10*3/uL — ABNORMAL HIGH (ref 0.7–4.0)
MCH: 28.2 pg (ref 26.0–34.0)
MCHC: 33.4 g/dL (ref 30.0–36.0)
MCV: 84.3 fL (ref 80.0–100.0)
Monocytes Absolute: 0.9 10*3/uL (ref 0.1–1.0)
Monocytes Relative: 8 %
Neutro Abs: 5.1 10*3/uL (ref 1.7–7.7)
Neutrophils Relative %: 45 %
Platelets: 165 10*3/uL (ref 150–400)
RBC: 5.29 MIL/uL (ref 4.22–5.81)
RDW: 13.2 % (ref 11.5–15.5)
Smear Review: NORMAL
WBC: 11.3 10*3/uL — ABNORMAL HIGH (ref 4.0–10.5)
nRBC: 0 % (ref 0.0–0.2)

## 2019-10-13 LAB — BASIC METABOLIC PANEL
Anion gap: 11 (ref 5–15)
BUN: 10 mg/dL (ref 6–20)
CO2: 22 mmol/L (ref 22–32)
Calcium: 8.8 mg/dL — ABNORMAL LOW (ref 8.9–10.3)
Chloride: 103 mmol/L (ref 98–111)
Creatinine, Ser: 0.9 mg/dL (ref 0.61–1.24)
GFR calc Af Amer: >60 mL/min (ref >60)
GFR calc non Af Amer: >60 mL/min (ref >60)
Glucose, Bld: 119 mg/dL — ABNORMAL HIGH (ref 70–99)
Potassium: 3.7 mmol/L (ref 3.5–5.1)
Sodium: 136 mmol/L (ref 135–145)

## 2019-10-13 LAB — TROPONIN I (HIGH SENSITIVITY): Troponin I (High Sensitivity): 524 ng/L (ref <18)

## 2019-10-13 LAB — SARS CORONAVIRUS 2 BY RT PCR (HOSPITAL ORDER, PERFORMED IN ~~LOC~~ HOSPITAL LAB): SARS Coronavirus 2: NEGATIVE

## 2019-10-13 LAB — BRAIN NATRIURETIC PEPTIDE: B Natriuretic Peptide: 16.2 pg/mL (ref 0.0–100.0)

## 2019-10-13 MED ORDER — RIVAROXABAN 15 MG PO TABS
15.0000 mg | ORAL_TABLET | Freq: Two times a day (BID) | ORAL | 0 refills | Status: DC
Start: 2019-10-13 — End: 2019-10-17

## 2019-10-13 MED ORDER — IOHEXOL 350 MG/ML SOLN
100.0000 mL | Freq: Once | INTRAVENOUS | Status: AC | PRN
  Administered 2019-10-13: 100 mL via INTRAVENOUS

## 2019-10-13 MED ORDER — HYDROCODONE-ACETAMINOPHEN 5-325 MG PO TABS
1.0000 | ORAL_TABLET | Freq: Once | ORAL | Status: AC
  Administered 2019-10-13: 1 via ORAL
  Filled 2019-10-13: qty 1

## 2019-10-13 MED ORDER — RIVAROXABAN 20 MG PO TABS
20.0000 mg | ORAL_TABLET | Freq: Every day | ORAL | 0 refills | Status: DC
Start: 2019-10-13 — End: 2019-10-17

## 2019-10-13 MED ORDER — FENTANYL CITRATE (PF) 100 MCG/2ML IJ SOLN
25.0000 ug | Freq: Once | INTRAMUSCULAR | Status: AC
  Administered 2019-10-13: 25 ug via INTRAVENOUS
  Filled 2019-10-13: qty 2

## 2019-10-13 MED ORDER — RIVAROXABAN 15 MG PO TABS
15.0000 mg | ORAL_TABLET | Freq: Once | ORAL | Status: AC
  Administered 2019-10-13: 15 mg via ORAL
  Filled 2019-10-13: qty 1

## 2019-10-13 MED ORDER — ONDANSETRON HCL 4 MG/2ML IJ SOLN
4.0000 mg | Freq: Once | INTRAMUSCULAR | Status: DC
  Filled 2019-10-13: qty 2

## 2019-10-13 MED ORDER — HEPARIN (PORCINE) 25000 UT/250ML-% IV SOLN
2300.0000 [IU]/h | INTRAVENOUS | Status: DC
  Administered 2019-10-13 – 2019-10-15 (×3): 1950 [IU]/h via INTRAVENOUS
  Administered 2019-10-15: 2100 [IU]/h via INTRAVENOUS
  Administered 2019-10-16: 2300 [IU]/h via INTRAVENOUS
  Filled 2019-10-13 (×5): qty 250

## 2019-10-13 MED ORDER — ACETAMINOPHEN 325 MG PO TABS
650.0000 mg | ORAL_TABLET | Freq: Once | ORAL | Status: AC
  Administered 2019-10-13: 650 mg via ORAL
  Filled 2019-10-13: qty 2

## 2019-10-13 MED ORDER — SODIUM CHLORIDE 0.9 % IV SOLN
INTRAVENOUS | Status: DC | PRN
  Administered 2019-10-13: 1000 mL via INTRAVENOUS

## 2019-10-13 MED ORDER — RIVAROXABAN (XARELTO) VTE STARTER PACK (15 & 20 MG)
ORAL_TABLET | ORAL | 0 refills | Status: DC
Start: 2019-10-13 — End: 2019-10-13

## 2019-10-13 NOTE — ED Notes (Signed)
At 2035 went to room to discharge pt. Pt was sitting on side of bed, diaphoretic, pale and tachypneic, nailbeds noted to be cyanotic. O2 sats 90-92% on RA and HR 140s. Pt c/o sudden onset of pain mid-back while getting dressed. EKG obtained, ED providers [place], [place] and Anitra Lauth, MD to bedside to evaluate. Pt placed on O2 by nasal cannula and piv x 2 started. Pt states he feels more comfortable after returning to bed. Will continue to monitor

## 2019-10-13 NOTE — ED Notes (Signed)
ED Provider at bedside. 

## 2019-10-13 NOTE — ED Notes (Signed)
Patient transported to CT 

## 2019-10-13 NOTE — ED Notes (Signed)
PA informed of pain 8/10 to left calf

## 2019-10-13 NOTE — ED Provider Notes (Addendum)
[facility] EMERGENCY DEPARTMENT Provider Note   CSN: [id number] Arrival date & time: 10/13/19  1452     History Chief Complaint  Patient presents with  . Leg Swelling    Christian Stevens is a 44 y.o. male with a past medical history significant for hyperlipidemia and hypertension who presents to the ED due to progressively worsening left lower extremity pain and swelling for the past few weeks.  Patient states Christian Stevens has been experiencing calf pain that has worsened over the past few weeks associated with edema.  Denies history of blood clots, recent surgeries, recent long immobilizations, and hormonal treatments.  Patient denies injury to left lower extremity.  Denies fever and chills.  Denies chest pain and shortness of breath.  Patient notes pain is worse with palpation and ambulation.  Patient was evaluated at urgent care and sent to the ED to rule out possible DVT.  Patient notes Christian Stevens works at [employer] and typically has bilateral, intermittent lower extremity edema and pain which typically resolves after 30 minutes however, this has been different given that it has not resolved with rest.  Patient also admits to generalized weakness over the past few days which has improved after taking off for days from work. Christian Stevens has been taking Tylenol with moderate relief.  History obtained from patient and past medical records. No interpreter used during encounter.      Past Medical History:  Diagnosis Date  . High cholesterol   . Hypertension     There are no problems to display for this patient.   History reviewed. No pertinent surgical history.     No family history on file.  Social History   Tobacco Use  . Smoking status: Never Smoker  . Smokeless tobacco: Never Used  Vaping Use  . Vaping Use: Never used  Substance Use Topics  . Alcohol use: No  . Drug use: No    Home Medications Prior to Admission medications   Medication Sig Start Date End Date Taking?  Authorizing Provider  amLODipine (NORVASC) 2.5 MG tablet Take 2.5 mg by mouth daily.    [provider]  diclofenac (VOLTAREN) 75 MG EC tablet Take 1 tablet (75 mg total) by mouth 2 (two) times daily. 01/27/17   Lenn Sinkegal, Norman S, DPM  diphenhydrAMINE (BENADRYL) 25 MG tablet Take 1 tablet (25 mg total) by mouth every 6 (six) hours as needed for itching (Rash). 01/31/13   Muthersbaugh, Dahlia ClientHannah, PA-C  famotidine (PEPCID) 20 MG tablet Take 1 tablet (20 mg total) by mouth 2 (two) times daily. 01/31/13   Muthersbaugh, Dahlia ClientHannah, PA-C  predniSONE (DELTASONE) 10 MG tablet 12 day tapering dose 12/30/16   Lenn Sinkegal, Norman S, DPM  predniSONE (DELTASONE) 20 MG tablet Take 2 tablets (40 mg total) by mouth daily. 01/31/13   Muthersbaugh, Dahlia ClientHannah, PA-C  RIVAROXABAN Carlena Hurl(XARELTO) VTE STARTER PACK (15 & 20 MG TABLETS) Follow package directions: Take one 15mg  tablet by mouth twice a day. On day 22, switch to one 20mg  tablet once a day. Take with food. 10/13/19   Mannie StabileAberman, Pete Schnitzer C, PA-C  simvastatin (ZOCOR) 20 MG tablet Take 20 mg by mouth daily.    [provider]    Allergies    Patient has no known allergies.  Review of Systems   Review of Systems  Constitutional: Negative for chills and fever.  Respiratory: Negative for shortness of breath.   Cardiovascular: Positive for leg swelling (LLE). Negative for chest pain.  All other systems reviewed and are  negative.   Physical Exam Updated Vital Signs BP 119/79   Pulse (!) 129   Temp 98.9 F (37.2 C) (Oral)   Resp (!) 21   Ht 6\' 1"  (1.854 m)   Wt (!) 147.4 kg   SpO2 95%   BMI 42.88 kg/m   Physical Exam Vitals and nursing note reviewed.  Constitutional:      General: Christian Stevens is not in acute distress.    Appearance: Christian Stevens is not ill-appearing.  HENT:     Head: Normocephalic.  Eyes:     Pupils: Pupils are equal, round, and reactive to light.  Cardiovascular:     Rate and Rhythm: Regular rhythm. Tachycardia present.     Pulses: Normal pulses.      Heart sounds: Normal heart sounds. No murmur heard.  No friction rub. No gallop.   Pulmonary:     Effort: Pulmonary effort is normal.     Breath sounds: Normal breath sounds.  Abdominal:     General: Abdomen is flat. There is no distension.     Palpations: Abdomen is soft.     Tenderness: There is no abdominal tenderness. There is no guarding or rebound.  Musculoskeletal:     Cervical back: Neck supple.     Comments: LLE non-pitting edema that extends from ankle to right above knee. Distal pulses and sensation intact. Full ROM of right knee and ankle. Calf tenderness. Positive homan sign. RLE normal.   Skin:    General: Skin is warm and dry.  Neurological:     General: No focal deficit present.     Mental Status: Christian Stevens is alert.  Psychiatric:        Mood and Affect: Mood normal.        Behavior: Behavior normal.     ED Results / Procedures / Treatments   Labs (all labs ordered are listed, but only abnormal results are displayed) Labs Reviewed  CBC WITH DIFFERENTIAL/PLATELET - Abnormal; Notable for the following components:      Result Value   WBC 11.3 (*)    Lymphs Abs 4.9 (*)    Abs Immature Granulocytes 0.08 (*)    All other components within normal limits  BASIC METABOLIC PANEL - Abnormal; Notable for the following components:   Glucose, Bld 119 (*)    Calcium 8.8 (*)    All other components within normal limits    EKG None  Radiology CT Angio Chest PE W and/or Wo Contrast  Result Date: 10/13/2019 CLINICAL DATA:  Left leg DVT diagnosed today. Diaphoresis and tachycardia. EXAM: CT ANGIOGRAPHY CHEST WITH CONTRAST TECHNIQUE: Multidetector CT imaging of the chest was performed using the standard protocol during bolus administration of intravenous contrast. Multiplanar CT image reconstructions and MIPs were obtained to evaluate the vascular anatomy. CONTRAST:  10/15/2019 OMNIPAQUE IOHEXOL 350 MG/ML SOLN COMPARISON:  Chest radiographs dated 01/31/2013 FINDINGS: Cardiovascular: The  pulmonary arteries are suboptimally visualized due to delayed timing of the contrast bolus and the large size of the patient. No gross pulmonary arterial filling defects are seen. Branch vessel defects are difficult to exclude. Normal sized heart. Mediastinum/Nodes: No enlarged mediastinal, hilar, or axillary lymph nodes. Thyroid gland, trachea, and esophagus demonstrate no significant findings. Lungs/Pleura: Minimal bilateral dependent atelectasis. No pleural fluid. Upper Abdomen: Unremarkable. Musculoskeletal: Thoracic spine degenerative changes. Review of the MIP images confirms the above findings. IMPRESSION: 1. Suboptimally visualized pulmonary arteries due to delayed timing of the contrast bolus and the large size of the patient. No gross pulmonary arterial  filling defects are seen. Branch vessel defects cannot be excluded. 2. Minimal bilateral dependent atelectasis. Electronically Signed   By: Beckie Salts M.D.   On: 10/13/2019 17:52   US Venous Img Lower Unilateral Left  Result Date: 10/13/2019 CLINICAL DATA:  Left lower leg pain and swelling for 2 weeks. EXAM: LEFT LOWER EXTREMITY VENOUS DOPPLER ULTRASOUND TECHNIQUE: Gray-scale sonography with compression, as well as color and duplex ultrasound, were performed to evaluate the deep venous system(s) from the level of the common femoral vein through the popliteal and proximal calf veins. COMPARISON:  None. FINDINGS: VENOUS Normal compressibility of the common femoral, superficial femoral, and popliteal veins. Visualized portions of profunda femoral vein and great saphenous vein unremarkable. Doppler waveforms show normal direction of venous flow, normal respiratory plasticity and response to augmentation. There is lack of compression in the mid to distal left posterior tibial vein with no flow on color Doppler. The proximal aspect of the vein could not be visualized. Additionally there is some evidence of occlusive thrombus at the tibial peroneal trunk.  The peroneal veins could not be visualized. Limited views of the contralateral common femoral vein are unremarkable. OTHER None. Limitations: none IMPRESSION: Positive for occlusive thrombus in the mid to distal posterior tibial vein in the left calf. There is also some evidence of thrombus at the tibial peroneal trunk in the calf. There is no evidence of thrombus in the common femoral through popliteal veins. These results will be called to the ordering clinician or representative by the Radiologist Assistant, and communication documented in the PACS or [employer]. Electronically Signed   By: Emmaline Kluver M.D.   On: 10/13/2019 16:09    Procedures Procedures (including critical care time) CRITICAL CARE Performed by: Mannie Stabile   Total critical care time: 40 minutes  Critical care time was exclusive of separately billable procedures and treating other patients.  Critical care was necessary to treat or prevent imminent or life-threatening deterioration.  Critical care was time spent personally by me on the following activities: development of treatment plan with patient and/or surrogate as well as nursing, discussions with consultants, evaluation of patient's response to treatment, examination of patient, obtaining history from patient or surrogate, ordering and performing treatments and interventions, ordering and review of laboratory studies, ordering and review of radiographic studies, pulse oximetry and re-evaluation of patient's condition.  Medications Ordered in ED Medications  Rivaroxaban (XARELTO) tablet 15 mg (has no administration in time range)  HYDROcodone-acetaminophen (NORCO/VICODIN) 5-325 MG per tablet 1 tablet (1 tablet Oral Given 10/13/19 1649)  iohexol (OMNIPAQUE) 350 MG/ML injection 100 mL (100 mLs Intravenous Contrast Given 10/13/19 1716)    ED Course  I have reviewed the triage vital signs and the nursing notes.  Pertinent labs & imaging results that  were available during my care of the patient were reviewed by me and considered in my medical decision making (see chart for details).  Clinical Course as of Oct 13 2315  Sat Oct 13, 2019  1606 Pulse Rate(!): 119 [CA]  1630 Pulse Rate(!): 132 [CA]  1630 Resp(!): 28 [CA]  2147 Discussed case with Dr. Antionette Char with [facility] who recommends adding BNP and troponin. Christian Stevens notes fever could be related to PE. Patient may need to be [facility] to [facility] rather than [facility]. Will update Dr. Antionette Char once BNP and troponin results are available.    [CA]  2245 Troponin I (High Sensitivity)(!!): 524 [CA]    Clinical Course User Index [CA] Mannie Stabile, PA-C  MDM Rules/Calculators/A&P                         44 year old male presents to the ED from urgent care due to left lower extremity edema and calf pain for the past few weeks.  Patient denies history of blood clots, recent surgeries, recent long immobilizations, and hormonal treatments.  Patient also admits to generalized weakness which has almost completely resolved since taking off 4 days of work. Denies chest pain and shortness of breath.  Upon arrival patient afebrile, tachycardic at 119, with otherwise normal vitals.  Patient in no acute distress and nontoxic-appearing.  Physical exam significant for left lower extremity nonpitting edema which extends from the left ankle to directly above the knee.  Positive Homan sign. Right calf tenderness. Distal pulses and sensation intact. RLE normal with no edema or tenderness. Will obtain routine labs to check renal function due to possible need to anticoagulant if [place] positive for DVT. and to rule out infectious etiology given generalized weakness. [place] ordered at triage. Low suspicion for CHF given no pitting edema bilaterally and lungs clear to auscultation bilaterally. Suspect symptoms related to possible DVT. Low suspicion for cellulitis.   [place] personally reviewed which demonstrates: IMPRESSION:  Positive for occlusive  thrombus in the mid to distal posterior  tibial vein in the left calf. There is also some evidence of  thrombus at the tibial peroneal trunk in the calf. There is no  evidence of thrombus in the common femoral through popliteal veins.    These results will be called to the ordering clinician or  representative by the Radiologist Assistant, and communication  documented in the PACS or [employer].   CBC significant for leukocytosis at 11.3 likely due to stress reaction.  BMP significant for mild hyperglycemia at 119 with no anion gap.  Doubt DKA.  Normal renal function.  No major electrolyte derangements.  5:04 PM patient continues to be tachycardic on the cardiac monitor. Will obtain CTA to rule out PE.   CTA personally reviewed which demonstrates: IMPRESSION:  1. Suboptimally visualized pulmonary arteries due to delayed timing  of the contrast bolus and the large size of the patient. No gross  pulmonary arterial filling defects are seen. Branch vessel defects  cannot be excluded.  2. Minimal bilateral dependent atelectasis.   Patient given 1 dose of Xarelto here in the ED.   8:53 PM patient attempted to be discharged and I was called to bedside by RN due to severe hypoxia and tachycardia.  I reported directly to bedside.  Patient diaphoretic, pale with heart rate in the 140s.  Patient hypoxic and placed on 5 L nasal cannula.  Patient admits to left sided mid back pain. Dr. Anitra Lauth evaluated patient along side myself.  Discussed case with Dr. Antionette Char who agrees to admit patient. Order placed for troponin and BNP per request. Discussed case with critical care doctor who notes that patient may go to the floor, but they can be consulted if patient becomes hemodynamically unstable.   Final Clinical Impression(s) / ED Diagnoses Final diagnoses:  Acute deep vein thrombosis (DVT) of distal vein of left lower extremity (HCC)    Rx / DC Orders ED Discharge Orders         Ordered     RIVAROXABAN (XARELTO) VTE STARTER PACK (15 & 20 MG TABLETS)     Discontinue  Reprint     10/13/19 1803  Mannie Stabile, PA-C 10/13/19 2319    Mannie Stabile, PA-C 10/14/19 1543    Gwyneth Sprout, MD 10/14/19 [phone]

## 2019-10-13 NOTE — ED Notes (Signed)
Patient transported to us 

## 2019-10-13 NOTE — Discharge Instructions (Addendum)
As discussed, your ultrasound is positive for a blood clot.  You were given 1 dose of a blood thinner here in the ER.  I am sending you home with a prescription for the blood thinner.  Take as prescribed.  You will start with taking 15mg  twice a day for 21 days then switch to 20mg  once a day. Make sure you take the medication with food.  Do not take any NSAIDs such as ibuprofen.  You may take Tylenol as needed for pain.  Please follow-up with your PCP within the next week for further evaluation.  Return to the ER if you develop chest pain, shortness of breath, or worsening of symptoms.

## 2019-10-13 NOTE — ED Triage Notes (Signed)
Pt reports lower L leg swelling and calf pain for "a couple of weeks." Pt seen at [facility] and sent here for an [place] to r/o DVT.

## 2019-10-13 NOTE — ED Notes (Signed)
[facility] notified (Kimberly) - patient ready for transport 

## 2019-10-13 NOTE — ED Notes (Signed)
Date and time results received: 10/13/19 2239 (use smartphrase ".now" to insert current time)  Test: Troponin Critical Value: 524  Name of Provider Notified: Anitra Lauth

## 2019-10-13 NOTE — Progress Notes (Signed)
ANTICOAGULATION CONSULT NOTE  Pharmacy Consult for Heparin Indication: pulmonary embolus and DVT  No Known Allergies  Patient Measurements: Height: 6\' 1"  (185.4 cm) Weight: (!) 147.4 kg (325 lb) IBW/kg (Calculated) : 79.9 Heparin Dosing Weight: 114.1 kg   Vital Signs: Temp: 99.2 F (37.3 C) (07/17 2004) Temp Source: Oral (07/17 1840) BP: 139/96 (07/17 2004) Pulse Rate: 140 (07/17 2040)  Labs: Recent Labs    10/13/19 1619  HGB 14.9  HCT 44.6  PLT 165  CREATININE 0.90    Estimated Creatinine Clearance: 160 mL/min (by C-G formula based on SCr of 0.9 mg/dL).   Medical History: Past Medical History:  Diagnosis Date  . High cholesterol   . Hypertension     Medications:  Scheduled:  . ondansetron (ZOFRAN) IV  4 mg Intravenous Once    Assessment: Patient is a 56 yom that presented to the ED for a 55 of his LLE. The patient was found to have a DVT in his LLE after [place]. Plan was to d/c patient on a DOAC and have him f/u outpatient. When the patient went to leave he became hypoxic and diaphoretic with s/s of a PE. Decision to admit the patient was made and pharmacy has been asked to dose heparin at this time.   Goal of Therapy:  Heparin level 0.3-0.7 units/ml aPTT 66-102 seconds Monitor platelets by anticoagulation protocol: Yes   Plan:  - Patient did receive a one time dose of Xarelto on 7/17 at 1832. With this will not bolus the patient. - However, with DVT worsening and progressing to a PE will start the Heparin drip @ 1950 units/hr at 2200 ~ 8 hours before his next dose would be due. Risk of worsening PE vs small bleeding risk as patient is young - Will monitor aPTT at this time as xarelto dose will likely interfere with heparin level  - aPTT and Heparin level in ~ 6 hours  - Monitor patient for s/s of bleeding and CBC while on heparin   8/17 PharmD. BCPS  10/13/2019,9:21 PM

## 2019-10-13 NOTE — Progress Notes (Signed)
eLink Physician-Brief Progress Note Patient Name: Christian Stevens DOB: 05/13/1975 MRN: [id number]   Date of Service  10/13/2019  HPI/Events of Note  Earlier [place] NP discussed about [name] for Leg DVT and was supposed to go home on [place], when he developed sob. On nasal o2. Sinus tachy. Elevated d dimer/troponine. Stable MAP. CTPA: sub optimal. Film reviewed personally from epic. No saddle emboli. Rt lower lobar ? PE difficult to tell.   Labs reviewed.  Works at [employer]. No other risk for DVT.Covid neg.     eICU Interventions  - advised to go to hospitalist service and call us if gets unstable like BP dropping. - on heparin gtt.  -need hypercoagulable work up.   Discussed/notified bed side ICU team/NP [facility].      Intervention Category Major Interventions: Other:;Respiratory failure - evaluation and management Minor Interventions: Communication with other healthcare providers and/or family  Ranee Gosselin 10/13/2019, 11:57 PM

## 2019-10-14 ENCOUNTER — Inpatient Hospital Stay (HOSPITAL_COMMUNITY): Payer: Managed Care, Other (non HMO)

## 2019-10-14 ENCOUNTER — Encounter (HOSPITAL_COMMUNITY): Payer: Self-pay | Admitting: Family Medicine

## 2019-10-14 DIAGNOSIS — I82422 Acute embolism and thrombosis of left iliac vein: Secondary | ICD-10-CM | POA: Diagnosis present

## 2019-10-14 DIAGNOSIS — R778 Other specified abnormalities of plasma proteins: Secondary | ICD-10-CM

## 2019-10-14 DIAGNOSIS — K219 Gastro-esophageal reflux disease without esophagitis: Secondary | ICD-10-CM | POA: Diagnosis present

## 2019-10-14 DIAGNOSIS — Z83438 Family history of other disorder of lipoprotein metabolism and other lipidemia: Secondary | ICD-10-CM | POA: Diagnosis not present

## 2019-10-14 DIAGNOSIS — I2699 Other pulmonary embolism without acute cor pulmonale: Secondary | ICD-10-CM | POA: Diagnosis present

## 2019-10-14 DIAGNOSIS — Z7901 Long term (current) use of anticoagulants: Secondary | ICD-10-CM | POA: Diagnosis not present

## 2019-10-14 DIAGNOSIS — I82409 Acute embolism and thrombosis of unspecified deep veins of unspecified lower extremity: Secondary | ICD-10-CM | POA: Diagnosis not present

## 2019-10-14 DIAGNOSIS — I82402 Acute embolism and thrombosis of unspecified deep veins of left lower extremity: Secondary | ICD-10-CM | POA: Diagnosis not present

## 2019-10-14 DIAGNOSIS — I2609 Other pulmonary embolism with acute cor pulmonale: Secondary | ICD-10-CM

## 2019-10-14 DIAGNOSIS — E782 Mixed hyperlipidemia: Secondary | ICD-10-CM

## 2019-10-14 DIAGNOSIS — I10 Essential (primary) hypertension: Secondary | ICD-10-CM

## 2019-10-14 DIAGNOSIS — I82412 Acute embolism and thrombosis of left femoral vein: Secondary | ICD-10-CM | POA: Diagnosis present

## 2019-10-14 DIAGNOSIS — I82442 Acute embolism and thrombosis of left tibial vein: Secondary | ICD-10-CM

## 2019-10-14 DIAGNOSIS — Z79899 Other long term (current) drug therapy: Secondary | ICD-10-CM | POA: Diagnosis not present

## 2019-10-14 DIAGNOSIS — I248 Other forms of acute ischemic heart disease: Secondary | ICD-10-CM | POA: Diagnosis present

## 2019-10-14 DIAGNOSIS — Z7952 Long term (current) use of systemic steroids: Secondary | ICD-10-CM | POA: Diagnosis not present

## 2019-10-14 DIAGNOSIS — R600 Localized edema: Secondary | ICD-10-CM | POA: Diagnosis present

## 2019-10-14 DIAGNOSIS — I871 Compression of vein: Secondary | ICD-10-CM | POA: Diagnosis present

## 2019-10-14 DIAGNOSIS — Z20822 Contact with and (suspected) exposure to covid-19: Secondary | ICD-10-CM | POA: Diagnosis present

## 2019-10-14 DIAGNOSIS — R0902 Hypoxemia: Secondary | ICD-10-CM | POA: Diagnosis present

## 2019-10-14 DIAGNOSIS — Z888 Allergy status to other drugs, medicaments and biological substances status: Secondary | ICD-10-CM | POA: Diagnosis not present

## 2019-10-14 DIAGNOSIS — R739 Hyperglycemia, unspecified: Secondary | ICD-10-CM | POA: Diagnosis present

## 2019-10-14 DIAGNOSIS — I82432 Acute embolism and thrombosis of left popliteal vein: Secondary | ICD-10-CM | POA: Diagnosis present

## 2019-10-14 DIAGNOSIS — Z8249 Family history of ischemic heart disease and other diseases of the circulatory system: Secondary | ICD-10-CM | POA: Diagnosis not present

## 2019-10-14 DIAGNOSIS — Z6841 Body Mass Index (BMI) 40.0 and over, adult: Secondary | ICD-10-CM | POA: Diagnosis not present

## 2019-10-14 DIAGNOSIS — M7989 Other specified soft tissue disorders: Secondary | ICD-10-CM | POA: Diagnosis present

## 2019-10-14 DIAGNOSIS — R7989 Other specified abnormal findings of blood chemistry: Secondary | ICD-10-CM | POA: Diagnosis present

## 2019-10-14 HISTORY — DX: Mixed hyperlipidemia: E78.2

## 2019-10-14 HISTORY — DX: Essential (primary) hypertension: I10

## 2019-10-14 HISTORY — DX: Gastro-esophageal reflux disease without esophagitis: K21.9

## 2019-10-14 LAB — CBC WITH DIFFERENTIAL/PLATELET
Abs Immature Granulocytes: 0 10*3/uL (ref 0.00–0.07)
Basophils Absolute: 0.2 10*3/uL — ABNORMAL HIGH (ref 0.0–0.1)
Basophils Relative: 2 %
Eosinophils Absolute: 0.1 10*3/uL (ref 0.0–0.5)
Eosinophils Relative: 1 %
HCT: 42.9 % (ref 39.0–52.0)
Hemoglobin: 14.3 g/dL (ref 13.0–17.0)
Lymphocytes Relative: 25 %
Lymphs Abs: 2.9 10*3/uL (ref 0.7–4.0)
MCH: 28.4 pg (ref 26.0–34.0)
MCHC: 33.3 g/dL (ref 30.0–36.0)
MCV: 85.3 fL (ref 80.0–100.0)
Monocytes Absolute: 1.1 10*3/uL — ABNORMAL HIGH (ref 0.1–1.0)
Monocytes Relative: 10 %
Neutro Abs: 7.1 10*3/uL (ref 1.7–7.7)
Neutrophils Relative %: 62 %
Platelets: 145 10*3/uL — ABNORMAL LOW (ref 150–400)
RBC: 5.03 MIL/uL (ref 4.22–5.81)
RDW: 13.6 % (ref 11.5–15.5)
WBC: 11.4 10*3/uL — ABNORMAL HIGH (ref 4.0–10.5)
nRBC: 0 % (ref 0.0–0.2)
nRBC: 0 /100 WBC

## 2019-10-14 LAB — TROPONIN I (HIGH SENSITIVITY): Troponin I (High Sensitivity): 1444 ng/L (ref <18)

## 2019-10-14 LAB — ECHOCARDIOGRAM COMPLETE
Area-P 1/2: 4.36 cm2
Height: 73 in
S' Lateral: 3.7 cm
Weight: 5153.6 oz

## 2019-10-14 LAB — HIV ANTIBODY (ROUTINE TESTING W REFLEX): HIV Screen 4th Generation wRfx: NONREACTIVE

## 2019-10-14 LAB — APTT
aPTT: 84 seconds — ABNORMAL HIGH (ref 24–36)
aPTT: 80 seconds — ABNORMAL HIGH (ref 24–36)

## 2019-10-14 LAB — HEPARIN LEVEL (UNFRACTIONATED): Heparin Unfractionated: >2.2 IU/mL — ABNORMAL HIGH (ref 0.30–0.70)

## 2019-10-14 MED ORDER — POLYETHYLENE GLYCOL 3350 17 G PO PACK: 17.0000 g | PACK | Freq: Every day | ORAL | Status: DC | PRN

## 2019-10-14 MED ORDER — ONDANSETRON HCL 4 MG PO TABS: 4.0000 mg | ORAL_TABLET | Freq: Four times a day (QID) | ORAL | Status: DC | PRN

## 2019-10-14 MED ORDER — AMLODIPINE BESYLATE 2.5 MG PO TABS
2.5000 mg | ORAL_TABLET | Freq: Every day | ORAL | Status: DC
  Administered 2019-10-14 – 2019-10-17 (×4): 2.5 mg via ORAL
  Filled 2019-10-14 (×4): qty 1

## 2019-10-14 MED ORDER — SIMVASTATIN 20 MG PO TABS
20.0000 mg | ORAL_TABLET | Freq: Every day | ORAL | Status: DC
  Administered 2019-10-14 – 2019-10-16 (×3): 20 mg via ORAL
  Filled 2019-10-14 (×3): qty 1

## 2019-10-14 MED ORDER — IOHEXOL 350 MG/ML SOLN
100.0000 mL | Freq: Once | INTRAVENOUS | Status: AC | PRN
  Administered 2019-10-14: 100 mL via INTRAVENOUS

## 2019-10-14 MED ORDER — FAMOTIDINE 20 MG PO TABS
20.0000 mg | ORAL_TABLET | Freq: Two times a day (BID) | ORAL | Status: DC
  Administered 2019-10-14 – 2019-10-17 (×7): 20 mg via ORAL
  Filled 2019-10-14 (×7): qty 1

## 2019-10-14 MED ORDER — ALBUTEROL SULFATE (2.5 MG/3ML) 0.083% IN NEBU: 2.5000 mg | INHALATION_SOLUTION | RESPIRATORY_TRACT | Status: DC | PRN

## 2019-10-14 MED ORDER — ONDANSETRON HCL 4 MG/2ML IJ SOLN: 4.0000 mg | Freq: Four times a day (QID) | INTRAMUSCULAR | Status: DC | PRN

## 2019-10-14 MED ORDER — ACETAMINOPHEN 325 MG PO TABS: 650.0000 mg | ORAL_TABLET | Freq: Four times a day (QID) | ORAL | Status: DC | PRN

## 2019-10-14 MED ORDER — ACETAMINOPHEN 650 MG RE SUPP: 650.0000 mg | Freq: Four times a day (QID) | RECTAL | Status: DC | PRN

## 2019-10-14 MED ORDER — LACTATED RINGERS IV SOLN: INTRAVENOUS | Status: AC

## 2019-10-14 NOTE — Progress Notes (Signed)
PROGRESS NOTE                                                                                                                                                                                                             Patient Demographics:    Christian Stevens, is a 44 y.o. male, DOB - Aug 10, 1975, VFI:[id number]  Admit date - 10/13/2019   Admitting Physician Briscoe Deutscher, MD  Outpatient Primary MD for the patient is Georgann Housekeeper, MD  LOS - 0   Chief Complaint  Patient presents with  . Leg Swelling       Brief Narrative    This is a no charge note for this patient was admitted earlier today by Dr. Leafy Half, imaging, labs, chart were reviewed.  44 year old male with past medical history of hypertension, gastroesophageal reflux disease, hyperlipidemia presenting with left lower extremity pain.  Patient explains that approximately 2 weeks ago he began to develop left lower extremity weakness and pain.  Patient describes it as the days progressed, this pain became severe in intensity, dull in quality and worse with ambulation or weightbearing.  Pain rated proximally.  As patient symptoms worsened, patient began to develop associated swelling of the affected extremity as well as a slight dull discoloration of the affected extremity in the days preceding his presentation.  At the time, patient denies any chest pain or shortness of breath.  Patient eventually presented to med [facility] emergency department for evaluation on 7/17.  Upon evaluation at [facility] emergency department, CT angiogram of the chest initially revealed no evidence of pulmonary embolism however this was a suboptimal study.  Lower extremity ultrasound did reveal a left posterior tibial DVT as well as some evidence of thrombus of the tibial peroneal trunk in the calf.  Patient was initially initiated on Xarelto and proceeded to ambulate out of the emergency  department when he suddenly developed sudden severe shortness of breath and respiratory distress.  Patient denied chest pain however.  Troponin was found to be 524.  Patient was initiated on intravenous heparin infusion.  Due to the severity of patient's symptoms, patient is being transferred to [facility] stepdown unit for continued management with full dose anticoagulation and specialty consultation as needed.    Subjective:    Christian Stevens today denies any chest pain  currently, report dyspnea has significantly improved, denies any cough, hemoptysis .    Assessment  & Plan :    Principal Problem:   Acute pulmonary embolism (HCC) Active Problems:   Acute deep vein thrombosis (DVT) of tibial vein of left lower extremity (HCC)   Elevated troponin level not due myocardial infarction   Mixed hyperlipidemia   Essential hypertension   GERD without esophagitis    Acute pulmonary embolism /acute left lower extremity DVT -Presentation with left lower extremity swelling, venous Doppler positive for acute DVT, initial CTA chest negative for PE, but patient developed sudden onset of dyspnea and chest pain on discharge, repeat CTA chest positive for submassive bilateral PE. -Patient developed acute PE while in ED despite received first dose of p.o. Xarelto, I would anticipate he will need heparin GTT for at least 72 hours, then he can be transitioned to Xarelto. -Echocardiogram with significant right heart strain, but patient with no hypoxia, tachycardia his PE is high score is 35, so no indication for thrombolytic currently, but he will be monitored closely and his right heart strain.      Acute deep vein thrombosis (DVT) of tibial vein of left lower extremity (HCC)   No clinical evidence of cerulea dolens on my examination.  On full dose anticoagulation with intravenous heparin for now  We will eventually transition patient back to oral anticoagulants as patient clinically  improves.  Concerning the etiology of this new thromboembolic event, this is apparently unprovoked.  -I have requested vascular surgery to evaluate to see if his candidate for lysis given his occlusive left lower extremity DVT, they will assess tomorrow and give recommendation accordingly.    Elevated troponin level not due myocardial infarction   Markedly elevated troponins, initially 524 that has dramatically risen to 1444 upon arrival to the stepdown unit.  Patient is currently chest pain-free, EKG with no regional wall motion abnormality, preserved EF at 55%.  This elevation of troponin is most likely secondary to cardiac strain due to acute thromboembolic disease  Regardless, patient is already on heparin infusion for acute thromboembolism.  EKG reveals no evidence of dynamic ST segment change.  Admitting physician discussed with Dr. Deforest Hoyles with cardiology that agrees this is unlikely to be secondary to plaque rupture.  He recommends continued work-up with echocardiogram in the morning.  He states that if repeat CT angiogram of the chest reveals no evidence of pulmonary embolism and echocardiogram reveals no evidence of right heart strain and cardiology should be formally consulted.    Mixed hyperlipidemia   Continue home regimen of statin therapy    Essential hypertension   Continue home regimen of antihypertensives  Monitor for episodic hypotension considering concerns for pulmonary embolism    GERD without esophagitis  Continue home regimen of H2 blocker.   COVID-19 Labs  No results for input(s): DDIMER, FERRITIN, LDH, CRP in the last 72 hours.  Lab Results  Component Value Date   SARSCOV2NAA NEGATIVE 10/13/2019     Code Status : Full  Family Communication  : none at bedside  Disposition Plan  :  Status is: Inpatient  Remains inpatient appropriate because:IV treatments appropriate due to intensity of illness or inability to take PO   Dispo: The  patient is from: Home              Anticipated d/c is to: Home              Anticipated d/c date is: 3 days  Patient currently is not medically stable to d/c.       Consults  :  vascular  Procedures  : None  DVT Prophylaxis  :  Heparin GTT  Lab Results  Component Value Date   PLT 145 (L) 10/14/2019    Antibiotics  :    Anti-infectives (From admission, onward)   None        Objective:   Vitals:   10/14/19 0315 10/14/19 0507 10/14/19 0848 10/14/19 1123  BP: (!) 134/96 119/82 107/83 138/83  Pulse: (!) 115 (!) 109 97 96  Resp:  18 18 18   Temp: 98.7 F (37.1 C) 97.7 F (36.5 C) 98.3 F (36.8 C) 98.5 F (36.9 C)  TempSrc: Oral Oral Oral Oral  SpO2: 92% 96% 97%   Weight:  (!) 146.1 kg    Height:        Wt Readings from Last 3 Encounters:  10/14/19 (!) 146.1 kg     Intake/Output Summary (Last 24 hours) at 10/14/2019 1617 Last data filed at 10/14/2019 1452 Gross per 24 hour  Intake 1323.13 ml  Output 1400 ml  Net -76.87 ml     Physical Exam  Awake Alert, Oriented X 3, No new F.N deficits, Normal affect Symmetrical Chest wall movement, Good air movement bilaterally, CTAB RRR,No Gallops,Rubs or new Murmurs, No Parasternal Heave +ve B.Sounds, Abd Soft, No tenderness, , No rebound - guarding or rigidity. No Cyanosis, Clubbing , left lower extremity swelling .    Data Review:    CBC Recent Labs  Lab 10/13/19 1619 10/14/19 0542  WBC 11.3* 11.4*  HGB 14.9 14.3  HCT 44.6 42.9  PLT 165 145*  MCV 84.3 85.3  MCH 28.2 28.4  MCHC 33.4 33.3  RDW 13.2 13.6  LYMPHSABS 4.9* 2.9  MONOABS 0.9 1.1*  EOSABS 0.1 0.1  BASOSABS 0.1 0.2*    Chemistries  Recent Labs  Lab 10/13/19 1619  NA 136  K 3.7  CL 103  CO2 22  GLUCOSE 119*  BUN 10  CREATININE 0.90  CALCIUM 8.8*   ------------------------------------------------------------------------------------------------------------------ No results for input(s): CHOL, HDL, LDLCALC, TRIG,  CHOLHDL, LDLDIRECT in the last 72 hours.  No results found for: HGBA1C ------------------------------------------------------------------------------------------------------------------ No results for input(s): TSH, T4TOTAL, T3FREE, THYROIDAB in the last 72 hours.  Invalid input(s): FREET3 ------------------------------------------------------------------------------------------------------------------ No results for input(s): VITAMINB12, FOLATE, FERRITIN, TIBC, IRON, RETICCTPCT in the last 72 hours.  Coagulation profile No results for input(s): INR, PROTIME in the last 168 hours.  No results for input(s): DDIMER in the last 72 hours.  Cardiac Enzymes No results for input(s): CKMB, TROPONINI, MYOGLOBIN in the last 168 hours.  Invalid input(s): CK ------------------------------------------------------------------------------------------------------------------    Component Value Date/Time   BNP 16.2 10/13/2019 2204    Inpatient Medications  Scheduled Meds: . amLODipine  2.5 mg Oral Daily  . famotidine  20 mg Oral BID  . ondansetron (ZOFRAN) IV  4 mg Intravenous Once  . simvastatin  20 mg Oral q1800   Continuous Infusions: . sodium chloride Stopped (10/14/19 0535)  . heparin 1,950 Units/hr (10/14/19 1452)   PRN Meds:.sodium chloride, acetaminophen **OR** acetaminophen, albuterol, ondansetron **OR** ondansetron (ZOFRAN) IV, polyethylene glycol  Micro Results Recent Results (from the past 240 hour(s))  SARS Coronavirus 2 by RT PCR (hospital order, performed in [facility] hospital lab) Nasopharyngeal Nasopharyngeal Swab     Status: None   Collection Time: 10/13/19  6:27 PM   Specimen: Nasopharyngeal Swab  Result Value Ref Range Status   SARS Coronavirus 2  NEGATIVE NEGATIVE Final    Comment: (NOTE) SARS-CoV-2 target nucleic acids are NOT DETECTED.  The SARS-CoV-2 RNA is generally detectable in upper and lower respiratory specimens during the acute phase of infection. The  lowest concentration of SARS-CoV-2 viral copies this assay can detect is 250 copies / mL. A negative result does not preclude SARS-CoV-2 infection and should not be used as the sole basis for treatment or other patient management decisions.  A negative result may occur with improper specimen collection / handling, submission of specimen other than nasopharyngeal swab, presence of viral mutation(s) within the areas targeted by this assay, and inadequate number of viral copies (<250 copies / mL). A negative result must be combined with clinical observations, patient history, and epidemiological information.  Fact Sheet for Patients:   BoilerBrush.com.cy  Fact Sheet for Healthcare Providers: https://pope.com/  This test is not yet approved or  cleared by the [place] FDA and has been authorized for detection and/or diagnosis of SARS-CoV-2 by FDA under an Emergency Use Authorization (EUA).  This EUA will remain in effect (meaning this test can be used) for the duration of the COVID-19 declaration under Section 564(b)(1) of the Act, 21 U.S.C. section 360bbb-3(b)(1), unless the authorization is terminated or revoked sooner.  Performed at [facility], [address] [place], [place] [address]     Radiology Reports CT ANGIO CHEST PE W OR WO CONTRAST  Result Date: 10/14/2019 CLINICAL DATA:  Clinical deterioration in patient with known deep venous thrombosis. Suspicion of pulmonary embolism. EXAM: CT ANGIOGRAPHY CHEST WITH CONTRAST TECHNIQUE: Multidetector CT imaging of the chest was performed using the standard protocol during bolus administration of intravenous contrast. Multiplanar CT image reconstructions and MIPs were obtained to evaluate the vascular anatomy. CONTRAST:  OMNIPAQUE IOHEXOL 350 MG/ML SOLN COMPARISON:  Suboptimal exam of 1 day prior FINDINGS: Cardiovascular: The quality of this exam for evaluation of  pulmonary embolism is moderate, but sufficient. Left lower lobe lobar and segmental pulmonary emboli identified. Segmental to subsegmental right-sided emboli. The RV-LV ratio is 5.8-3.7 cm=1.6. Bovine arch.  Mild cardiomegaly. Mediastinum/Nodes: No mediastinal or hilar adenopathy. Lungs/Pleura: No pleural fluid. 2 mm right upper lobe pulmonary nodules x2 on 52/6. Upper Abdomen: Hepatic steatosis. Normal imaged portions of the spleen, stomach, pancreas, gallbladder. Musculoskeletal: No acute osseous abnormality. Review of the MIP images confirms the above findings. IMPRESSION: 1. Left larger than right, moderate volume pulmonary emboli. Positive for acute PE with CT evidence of right heart strain (RV/LV Ratio = 1.6) consistent with at least submassive (intermediate risk) PE. The presence of right heart strain has been associated with an increased risk of morbidity and mortality. Please refer to the "PE Focused" order set in EPIC. 2. Tiny right upper lobe pulmonary nodules. No follow-up needed if patient is low-risk. Non-contrast chest CT can be considered in 12 months if patient is high-risk. This recommendation follows the consensus statement: Guidelines for Management of Incidental Pulmonary Nodules Detected on CT Images: From the Fleischner Society 2017; Radiology 2017; 284:228-243. 3. Hepatic steatosis. These results will be called to the ordering clinician or representative by the Radiologist Assistant, and communication documented in the PACS or [employer]. Electronically Signed   By: Jeronimo Greaves M.D.   On: 10/14/2019 08:02   CT Angio Chest PE W and/or Wo Contrast  Result Date: 10/13/2019 CLINICAL DATA:  Left leg DVT diagnosed today. Diaphoresis and tachycardia. EXAM: CT ANGIOGRAPHY CHEST WITH CONTRAST TECHNIQUE: Multidetector CT imaging of the chest was performed  using the standard protocol during bolus administration of intravenous contrast. Multiplanar CT image reconstructions and MIPs were  obtained to evaluate the vascular anatomy. CONTRAST:  100mL OMNIPAQUE IOHEXOL 350 MG/ML SOLN COMPARISON:  Chest radiographs dated 01/31/2013 FINDINGS: Cardiovascular: The pulmonary arteries are suboptimally visualized due to delayed timing of the contrast bolus and the large size of the patient. No gross pulmonary arterial filling defects are seen. Branch vessel defects are difficult to exclude. Normal sized heart. Mediastinum/Nodes: No enlarged mediastinal, hilar, or axillary lymph nodes. Thyroid gland, trachea, and esophagus demonstrate no significant findings. Lungs/Pleura: Minimal bilateral dependent atelectasis. No pleural fluid. Upper Abdomen: Unremarkable. Musculoskeletal: Thoracic spine degenerative changes. Review of the MIP images confirms the above findings. IMPRESSION: 1. Suboptimally visualized pulmonary arteries due to delayed timing of the contrast bolus and the large size of the patient. No gross pulmonary arterial filling defects are seen. Branch vessel defects cannot be excluded. 2. Minimal bilateral dependent atelectasis. Electronically Signed   By: Beckie SaltsSteven  Reid M.D.   On: 10/13/2019 17:52   US Venous Img Lower Unilateral Left  Result Date: 10/13/2019 CLINICAL DATA:  Left lower leg pain and swelling for 2 weeks. EXAM: LEFT LOWER EXTREMITY VENOUS DOPPLER ULTRASOUND TECHNIQUE: Gray-scale sonography with compression, as well as color and duplex ultrasound, were performed to evaluate the deep venous system(s) from the level of the common femoral vein through the popliteal and proximal calf veins. COMPARISON:  None. FINDINGS: VENOUS Normal compressibility of the common femoral, superficial femoral, and popliteal veins. Visualized portions of profunda femoral vein and great saphenous vein unremarkable. Doppler waveforms show normal direction of venous flow, normal respiratory plasticity and response to augmentation. There is lack of compression in the mid to distal left posterior tibial vein with no  flow on color Doppler. The proximal aspect of the vein could not be visualized. Additionally there is some evidence of occlusive thrombus at the tibial peroneal trunk. The peroneal veins could not be visualized. Limited views of the contralateral common femoral vein are unremarkable. OTHER None. Limitations: none IMPRESSION: Positive for occlusive thrombus in the mid to distal posterior tibial vein in the left calf. There is also some evidence of thrombus at the tibial peroneal trunk in the calf. There is no evidence of thrombus in the common femoral through popliteal veins. These results will be called to the ordering clinician or representative by the Radiologist Assistant, and communication documented in the PACS or [employer] Dashboard. Electronically Signed   By: Emmaline KluverNancy  Ballantyne M.D.   On: 10/13/2019 16:09   ECHOCARDIOGRAM COMPLETE  Result Date: 10/14/2019    ECHOCARDIOGRAM REPORT   Patient Name:   Roxanna MewCHRISTOPHER Gallaway Date of Exam: 10/14/2019 Medical Rec #:  [id number]           Height:       73.0 in Accession #:    [id number]          Weight:       322.1 lb Date of Birth:  September 17, 1975           BSA:          2.635 m Patient Age:    43 years            BP:           107/83 mmHg Patient Gender: M                   HR:           95 bpm. Exam Location:  Inpatient Procedure: 2D Echo Indications:    pulmonary embolus 415.19  History:        Patient has no prior history of Echocardiogram examinations.                 Risk Factors:Hypertension and Dyslipidemia.  Sonographer:    Delcie Roch Referring Phys: [id number] Deno Lunger [place] IMPRESSIONS  1. Left ventricular ejection fraction, by estimation, is 55%%. The left ventricle has normal function. The left ventricle has no regional wall motion abnormalities. There is mild left ventricular hypertrophy. Left ventricular diastolic parameters were normal.  2. Right ventricular systolic function is severely reduced. The right ventricular size is moderately enlarged.  There is normal pulmonary artery systolic pressure.  3. The mitral valve is normal in structure. Trivial mitral valve regurgitation.  4. The aortic valve is normal in structure. Aortic valve regurgitation is not visualized.  5. The inferior vena cava is dilated in size with >50% respiratory variability, suggesting right atrial pressure of 8 mmHg. FINDINGS  Left Ventricle: Left ventricular ejection fraction, by estimation, is 55%%. The left ventricle has normal function. The left ventricle has no regional wall motion abnormalities. The left ventricular internal cavity size was normal in size. There is mild  left ventricular hypertrophy. Left ventricular diastolic parameters were normal. Right Ventricle: The right ventricular size is moderately enlarged. Right vetricular wall thickness was not assessed. Right ventricular systolic function is severely reduced. There is normal pulmonary artery systolic pressure. The tricuspid regurgitant velocity is 2.21 m/s, and with an assumed right atrial pressure of 3 mmHg, the estimated right ventricular systolic pressure is 22.5 mmHg. Left Atrium: Left atrial size was normal in size. Right Atrium: Right atrial size was normal in size. Pericardium: There is no evidence of pericardial effusion. Mitral Valve: The mitral valve is normal in structure. Trivial mitral valve regurgitation. Tricuspid Valve: The tricuspid valve is normal in structure. Tricuspid valve regurgitation is mild. Aortic Valve: The aortic valve is normal in structure. Aortic valve regurgitation is not visualized. Pulmonic Valve: The pulmonic valve was normal in structure. Pulmonic valve regurgitation is trivial. Aorta: The aortic root is normal in size and structure. Venous: The inferior vena cava is dilated in size with greater than 50% respiratory variability, suggesting right atrial pressure of 8 mmHg. IAS/Shunts: The interatrial septum was not assessed.  LEFT VENTRICLE PLAX 2D LVIDd:         5.00 cm  Diastology  LVIDs:         3.70 cm  LV e' lateral:   13.30 cm/s LV PW:         1.00 cm  LV E/e' lateral: 4.6 LV IVS:        0.90 cm  LV e' medial:    8.92 cm/s LVOT diam:     2.40 cm  LV E/e' medial:  6.8 LV SV:         62 LV SV Index:   23 LVOT Area:     4.52 cm  RIGHT VENTRICLE            IVC RV S prime:     8.70 cm/s  IVC diam: 2.10 cm TAPSE (M-mode): 1.6 cm LEFT ATRIUM             Index       RIGHT ATRIUM           Index LA diam:        3.80 cm 1.44 cm/m  RA Area:  14.40 cm LA Vol (A2C):   37.5 ml 14.23 ml/m RA Volume:   34.20 ml  12.98 ml/m LA Vol (A4C):   34.5 ml 13.09 ml/m LA Biplane Vol: 37.6 ml 14.27 ml/m  AORTIC VALVE LVOT Vmax:   84.20 cm/s LVOT Vmean:  60.000 cm/s LVOT VTI:    0.136 m  AORTA Ao Root diam: 3.50 cm Ao Asc diam:  3.20 cm MITRAL VALVE               TRICUSPID VALVE MV Area (PHT): 4.36 cm    TV Peak grad:   18.7 mmHg MV Decel Time: 174 msec    TV Vmax:        2.16 m/s MV E velocity: 60.80 cm/s  TR Peak grad:   19.5 mmHg MV A velocity: 70.70 cm/s  TR Vmax:        221.00 cm/s MV E/A ratio:  0.86                            SHUNTS                            Systemic VTI:  0.14 m                            Systemic Diam: 2.40 cm Dietrich Pates MD Electronically signed by Dietrich Pates MD Signature Date/Time: 10/14/2019/12:04:27 PM    Final      Huey Bienenstock M.D on 10/14/2019 at 4:17 PM    [facility] -  Office  [phone]

## 2019-10-14 NOTE — Progress Notes (Signed)
  Echocardiogram 2D Echocardiogram has been performed.  Delcie Roch 10/14/2019, 9:58 AM

## 2019-10-14 NOTE — Progress Notes (Signed)
ANTICOAGULATION CONSULT NOTE  Pharmacy Consult for Heparin Indication: pulmonary embolus and DVT  No Known Allergies  Patient Measurements: Height: 6\' 1"  (185.4 cm) Weight: (!) 146.1 kg (322 lb 1.6 oz) IBW/kg (Calculated) : 79.9 Heparin Dosing Weight: 114.1 kg   Vital Signs: Temp: 97.7 F (36.5 C) (07/18 0507) Temp Source: Oral (07/18 0507) BP: 119/82 (07/18 0507) Pulse Rate: 109 (07/18 0507)  Labs: Recent Labs    10/13/19 1619 10/13/19 2204 10/14/19 0013 10/14/19 0542  HGB 14.9  --   --  14.3  HCT 44.6  --   --  42.9  PLT 165  --   --  145*  APTT  --   --   --  84*  HEPARINUNFRC  --   --   --  >2.20*  CREATININE 0.90  --   --   --   TROPONINIHS  --  524* 1,444*  --     Estimated Creatinine Clearance: 159.3 mL/min (by C-G formula based on SCr of 0.9 mg/dL).   Medical History: Past Medical History:  Diagnosis Date  . Essential hypertension 10/14/2019  . GERD without esophagitis 10/14/2019  . High cholesterol   . Hypertension   . Mixed hyperlipidemia 10/14/2019    Medications:  Scheduled:  . amLODipine  2.5 mg Oral Daily  . famotidine  20 mg Oral BID  . ondansetron (ZOFRAN) IV  4 mg Intravenous Once  . simvastatin  20 mg Oral q1800    Assessment: Patient is a 66 yom that presented to the ED for a 55 of his LLE. The patient was found to have a DVT in his LLE after [place]. Plan was to d/c patient on a DOAC and have him f/u outpatient. When the patient went to leave he became hypoxic and diaphoretic with s/s of a PE. Decision to admit the patient was made and pharmacy has been asked to dose heparin at this time.  Patient did receive a one time dose of Xarelto on 7/17 at 1832.  Heparin level >2.2 (effects of Xarelto dose), PTT 84 sec (therapeutic) on gtt at 1950 units/hr. No bleeding noted. Will utilize PTT monitoring until heparin levels coordinating.   Goal of Therapy:  Heparin level 0.3-0.7 units/ml aPTT 66-102 seconds Monitor platelets by anticoagulation  protocol: Yes   Plan:  Continue heparin drip @ 1950 units/hr  Will f/u confirmatory PTT in 6 hours  8/17, PharmD, BCPS Please see amion for complete clinical pharmacist phone list 10/14/2019,6:53 AM

## 2019-10-14 NOTE — Progress Notes (Signed)
ANTICOAGULATION CONSULT NOTE  Pharmacy Consult for Heparin Indication: pulmonary embolus and DVT  No Known Allergies  Patient Measurements: Height: 6\' 1"  (185.4 cm) Weight: (!) 146.1 kg (322 lb 1.6 oz) IBW/kg (Calculated) : 79.9 Heparin Dosing Weight: 114.1 kg   Vital Signs: Temp: 98.5 F (36.9 C) (07/18 1123) Temp Source: Oral (07/18 1123) BP: 138/83 (07/18 1123) Pulse Rate: 96 (07/18 1123)  Labs: Recent Labs    10/13/19 1619 10/13/19 2204 10/14/19 0013 10/14/19 0542 10/14/19 1202  HGB 14.9  --   --  14.3  --   HCT 44.6  --   --  42.9  --   PLT 165  --   --  145*  --   APTT  --   --   --  84* 80*  HEPARINUNFRC  --   --   --  >2.20*  --   CREATININE 0.90  --   --   --   --   TROPONINIHS  --  524* 1,444*  --   --     Estimated Creatinine Clearance: 159.3 mL/min (by C-G formula based on SCr of 0.9 mg/dL).   Medical History: Past Medical History:  Diagnosis Date  . Essential hypertension 10/14/2019  . GERD without esophagitis 10/14/2019  . High cholesterol   . Hypertension   . Mixed hyperlipidemia 10/14/2019    Medications:  Scheduled:  . amLODipine  2.5 mg Oral Daily  . famotidine  20 mg Oral BID  . ondansetron (ZOFRAN) IV  4 mg Intravenous Once  . simvastatin  20 mg Oral q1800    Assessment: Patient is a 54 yom that presented to the ED for a 55 of his LLE. The patient was found to have a DVT in his LLE after [place]. Plan was to d/c patient on a DOAC and have him f/u outpatient. When the patient went to leave he became hypoxic and diaphoretic with s/s of a PE. Decision to admit the patient was made and pharmacy has been asked to dose heparin at this time.   Patient did receive a one time dose of Xarelto on 7/17 at 1832.  Heparin level >2.2 (effects of Xarelto dose) with a PTT 84 sec (therapeutic) on gtt at 1950 units/hr. A confirmatory 6-hr PTT was drawn and resulted at 80 sec (therapeutic). The patient has had two therapeutic PTT levels, therefore, a PTT and  heparin level will be drawn in the morning and the drip will continue at the current rate. No bleeding noted or documented. CBC is WNL.  Goal of Therapy:  Heparin level 0.3-0.7 units/ml aPTT 66-102 seconds Monitor platelets by anticoagulation protocol: Yes   Plan:  Continue heparin IV @ 1950 units/hr  Obtain daily heparin level and PTT Monitor for signs and symptoms of bleeding  8/17, PharmD, RPh  PGY-1 Pharmacy Resident 10/14/2019 1:38 PM  Please check [email or web address] for unit-specific pharmacy phone numbers.

## 2019-10-14 NOTE — Progress Notes (Signed)
CRITICAL VALUE ALERT  Critical Value:  Chest CT + PE  Date & Time Notied:  10/14/19 0819hrs  Provider Notified: Dr. Randol Kern  Orders Received/Actions taken: Patient on heparin gtt IV. Will continue to monitor

## 2019-10-14 NOTE — ED Notes (Signed)
Date and time results received: 10/14/19 0058   Test: Troponin Critical Value: 1444  Name of Provider Notified: Maxine Glenn (pt has already transferred to 6E at [facility]) floor called to ensure result was known to pt's care team.

## 2019-10-14 NOTE — H&P (Signed)
History and Physical    Christian Stevens ZOX:[id number] DOB: [birth year] DOA: 10/13/2019  PCP: Georgann Housekeeper, MD  Patient coming from: [facility] transfer   Chief Complaint:  Chief Complaint  Patient presents with  . Leg Swelling     HPI:    44 year old male with past medical history of hypertension, gastroesophageal reflux disease, hyperlipidemia presenting with left lower extremity pain.  Patient explains that approximately 2 weeks ago he began to develop left lower extremity weakness and pain.  Patient describes it as the days progressed, this pain became severe in intensity, dull in quality and worse with ambulation or weightbearing.  Pain rated proximally.  As patient symptoms worsened, patient began to develop associated swelling of the affected extremity as well as a slight dull discoloration of the affected extremity in the days preceding his presentation.  At the time, patient denies any chest pain or shortness of breath.  Patient eventually presented to med [facility] emergency department for evaluation on 7/17.  Upon evaluation at [facility] emergency department, CT angiogram of the chest initially revealed no evidence of pulmonary embolism however this was a suboptimal study.  Lower extremity ultrasound did reveal a left posterior tibial DVT as well as some evidence of thrombus of the tibial peroneal trunk in the calf.  Patient was initially initiated on Xarelto and proceeded to ambulate out of the emergency department when he suddenly developed sudden severe shortness of breath and respiratory distress.  Patient denied chest pain however.  Troponin was found to be 524.  Patient was initiated on intravenous heparin infusion.  Due to the severity of patient's symptoms, patient is being transferred to [facility] stepdown unit for continued management with full dose anticoagulation and specialty consultation as needed.    Review of Systems: A 10-system review  of systems has been performed and all systems are negative with the exception of what is listed in the HPI except for the following:  GENERAL: Complains of a 6-week history of malaise.   Past Medical History:  Diagnosis Date  . Essential hypertension 10/14/2019  . GERD without esophagitis 10/14/2019  . High cholesterol   . Hypertension   . Mixed hyperlipidemia 10/14/2019    History reviewed. No pertinent surgical history.   reports that he has never smoked. He has never used smokeless tobacco. He reports that he does not drink alcohol and does not use drugs.  No Known Allergies  Family History  Problem Relation Age of Onset  . Hypertension Father   . Hyperlipidemia Father      Prior to Admission medications   Medication Sig Start Date End Date Taking? Authorizing Provider  amLODipine (NORVASC) 2.5 MG tablet Take 2.5 mg by mouth daily.    [provider]  diclofenac (VOLTAREN) 75 MG EC tablet Take 1 tablet (75 mg total) by mouth 2 (two) times daily. 01/27/17   Lenn Sink, DPM  diphenhydrAMINE (BENADRYL) 25 MG tablet Take 1 tablet (25 mg total) by mouth every 6 (six) hours as needed for itching (Rash). 01/31/13   Muthersbaugh, Dahlia Client, PA-C  famotidine (PEPCID) 20 MG tablet Take 1 tablet (20 mg total) by mouth 2 (two) times daily. 01/31/13   Muthersbaugh, Dahlia Client, PA-C  predniSONE (DELTASONE) 10 MG tablet 12 day tapering dose 12/30/16   Lenn Sink, DPM  predniSONE (DELTASONE) 20 MG tablet Take 2 tablets (40 mg total) by mouth daily. 01/31/13   Muthersbaugh, Dahlia Client, PA-C  Rivaroxaban (XARELTO) 15 MG TABS tablet Take  1 tablet (15 mg total) by mouth 2 (two) times daily with a meal. 10/13/19   Aberman, Merla Richesaroline C, PA-C  rivaroxaban (XARELTO) 20 MG TABS tablet Take 1 tablet (20 mg total) by mouth daily with supper. 10/13/19   Mannie StabileAberman, Caroline C, PA-C  simvastatin (ZOCOR) 20 MG tablet Take 20 mg by mouth daily.    [provider]    Physical Exam: Vitals:   10/14/19  0000 10/14/19 0014 10/14/19 0058 10/14/19 0315  BP: 111/82 (!) 131/98 (!) 134/94 (!) 134/96  Pulse: (!) 117 (!) 124 (!) 124 (!) 115  Resp: (!) 28 (!) 25 19   Temp:   98 F (36.7 C) 98.7 F (37.1 C)  TempSrc:   Oral Oral  SpO2: 96% 98% 99% 92%  Weight:      Height:        Constitutional: Acute alert and oriented x3, patient is in slight respiratory distress.  Patient is diaphoretic and obese. Skin: Skin is diaphoretic and clammy.  No rashes, no lesions, good skin turgor noted. Eyes: Pupils are equally reactive to light.  No evidence of scleral icterus or conjunctival pallor.  ENMT: Moist mucous membranes noted.  Posterior pharynx clear of any exudate or lesions.   Neck: normal, supple, no masses, no thyromegaly.  No evidence of jugular venous distension.   Respiratory: clear to auscultation bilaterally, no wheezing, no crackles. Normal respiratory effort. No accessory muscle use.  Cardiovascular: Tachycardic but regular, no murmurs / rubs / gallops.  Substantial edema of the distal left lower extremity.  2+ pedal pulses. No carotid bruits.  Chest:   Nontender without crepitus or deformity.   Back:   Nontender without crepitus or deformity. Abdomen: Abdomen is soft and nontender.  No evidence of intra-abdominal masses.  Positive bowel sounds noted in all quadrants.   Musculoskeletal: Notable substantial edema of the distal left lower extremity with notable tenderness in the left posterior calf.  No joint deformity upper and lower extremities. Good ROM, no contractures. Normal muscle tone.  Neurologic: CN 2-12 grossly intact. Sensation intact, strength noted to be 5 out of 5 in all 4 extremities.  Patient is following all commands.  Patient is responsive to verbal stimuli.   Psychiatric: Patient presents as a normal mood with appropriate affect.  Patient seems to possess insight as to theircurrent situation.     Labs on Admission: I have personally reviewed following labs and imaging studies  -   CBC: Recent Labs  Lab 10/13/19 1619  WBC 11.3*  NEUTROABS 5.1  HGB 14.9  HCT 44.6  MCV 84.3  PLT 165   Basic Metabolic Panel: Recent Labs  Lab 10/13/19 1619  NA 136  K 3.7  CL 103  CO2 22  GLUCOSE 119*  BUN 10  CREATININE 0.90  CALCIUM 8.8*   GFR: Estimated Creatinine Clearance: 160 mL/min (by C-G formula based on SCr of 0.9 mg/dL). Liver Function Tests: No results for input(s): AST, ALT, ALKPHOS, BILITOT, PROT, ALBUMIN in the last 168 hours. No results for input(s): LIPASE, AMYLASE in the last 168 hours. No results for input(s): AMMONIA in the last 168 hours. Coagulation Profile: No results for input(s): INR, PROTIME in the last 168 hours. Cardiac Enzymes: No results for input(s): CKTOTAL, CKMB, CKMBINDEX, TROPONINI in the last 168 hours. BNP (last 3 results) No results for input(s): PROBNP in the last 8760 hours. HbA1C: No results for input(s): HGBA1C in the last 72 hours. CBG: No results for input(s): GLUCAP in the last 168  hours. Lipid Profile: No results for input(s): CHOL, HDL, LDLCALC, TRIG, CHOLHDL, LDLDIRECT in the last 72 hours. Thyroid Function Tests: No results for input(s): TSH, T4TOTAL, FREET4, T3FREE, THYROIDAB in the last 72 hours. Anemia Panel: No results for input(s): VITAMINB12, FOLATE, FERRITIN, TIBC, IRON, RETICCTPCT in the last 72 hours. Urine analysis: No results found for: COLORURINE, APPEARANCEUR, LABSPEC, PHURINE, GLUCOSEU, HGBUR, BILIRUBINUR, KETONESUR, PROTEINUR, UROBILINOGEN, NITRITE, LEUKOCYTESUR  Radiological Exams on Admission - Personally Reviewed: CT Angio Chest PE W and/or Wo Contrast  Result Date: 10/13/2019 CLINICAL DATA:  Left leg DVT diagnosed today. Diaphoresis and tachycardia. EXAM: CT ANGIOGRAPHY CHEST WITH CONTRAST TECHNIQUE: Multidetector CT imaging of the chest was performed using the standard protocol during bolus administration of intravenous contrast. Multiplanar CT image reconstructions and MIPs were obtained  to evaluate the vascular anatomy. CONTRAST:  OMNIPAQUE IOHEXOL 350 MG/ML SOLN COMPARISON:  Chest radiographs dated 01/31/2013 FINDINGS: Cardiovascular: The pulmonary arteries are suboptimally visualized due to delayed timing of the contrast bolus and the large size of the patient. No gross pulmonary arterial filling defects are seen. Branch vessel defects are difficult to exclude. Normal sized heart. Mediastinum/Nodes: No enlarged mediastinal, hilar, or axillary lymph nodes. Thyroid gland, trachea, and esophagus demonstrate no significant findings. Lungs/Pleura: Minimal bilateral dependent atelectasis. No pleural fluid. Upper Abdomen: Unremarkable. Musculoskeletal: Thoracic spine degenerative changes. Review of the MIP images confirms the above findings. IMPRESSION: 1. Suboptimally visualized pulmonary arteries due to delayed timing of the contrast bolus and the large size of the patient. No gross pulmonary arterial filling defects are seen. Branch vessel defects cannot be excluded. 2. Minimal bilateral dependent atelectasis. Electronically Signed   By: Beckie Salts M.D.   On: 10/13/2019 17:52   US Venous Img Lower Unilateral Left  Result Date: 10/13/2019 CLINICAL DATA:  Left lower leg pain and swelling for 2 weeks. EXAM: LEFT LOWER EXTREMITY VENOUS DOPPLER ULTRASOUND TECHNIQUE: Gray-scale sonography with compression, as well as color and duplex ultrasound, were performed to evaluate the deep venous system(s) from the level of the common femoral vein through the popliteal and proximal calf veins. COMPARISON:  None. FINDINGS: VENOUS Normal compressibility of the common femoral, superficial femoral, and popliteal veins. Visualized portions of profunda femoral vein and great saphenous vein unremarkable. Doppler waveforms show normal direction of venous flow, normal respiratory plasticity and response to augmentation. There is lack of compression in the mid to distal left posterior tibial vein with no flow on  color Doppler. The proximal aspect of the vein could not be visualized. Additionally there is some evidence of occlusive thrombus at the tibial peroneal trunk. The peroneal veins could not be visualized. Limited views of the contralateral common femoral vein are unremarkable. OTHER None. Limitations: none IMPRESSION: Positive for occlusive thrombus in the mid to distal posterior tibial vein in the left calf. There is also some evidence of thrombus at the tibial peroneal trunk in the calf. There is no evidence of thrombus in the common femoral through popliteal veins. These results will be called to the ordering clinician or representative by the Radiologist Assistant, and communication documented in the PACS or [employer]. Electronically Signed   By: Emmaline Kluver M.D.   On: 10/13/2019 16:09    EKG: Personally reviewed.  Rhythm is sinus tachycardia with heart rate of 121 bpm.  No dynamic ST segment changes appreciated.  Assessment/Plan Principal Problem:   Acute pulmonary embolism (HCC)   Clinically, patient is suspected to have developed an acute pulmonary embolism since being diagnosed with a  left lower extremity DVT at [facility] several hours ago.  I feel patient is likely developed a pulmonary embolism at the moment he was attempting to ambulate out of med [facility] emergency department.  Initial CT angiogram of the chest and additionally was suboptimal.  Considering patient's dramatic clinical presentation and markedly elevated troponin that has risen to 1444, will obtain repeat CT angiogram of the chest to ensure patient has not developed a saddle embolus or a degree of clot burden that would require thrombectomy.  Monitoring patient closely on telemetry in the stepdown unit for hemodynamic instability  Continuing intravenous heparin for now  Echocardiogram in the morning to evaluate for degree of right heart strain  Supplemental oxygen as needed for  hypoxia  As needed bronchodilator therapy for shortness of breath or wheezing.  Once patient clinically improves will consider transitioning patient back to oral anticoagulants in the coming days.  Concerning the etiology of this new thromboembolic event, this is apparently unprovoked.  Active Problems:   Acute deep vein thrombosis (DVT) of tibial vein of left lower extremity (HCC)   No clinical evidence of cerulea dolens on my examination.  On full dose anticoagulation with intravenous heparin for now  We will eventually transition patient back to oral anticoagulants as patient clinically improves.  Concerning the etiology of this new thromboembolic event, this is apparently unprovoked.     Elevated troponin level not due myocardial infarction   Markedly elevated troponins, initially 524 that has dramatically risen to 1444 upon arrival to the stepdown unit.  Patient is currently chest pain-free but is complaining of vague midthoracic back pain.  This elevation of troponin is most likely secondary to cardiac strain due to acute thromboembolic disease  Regardless, patient is already on heparin infusion for acute thromboembolism.  EKG reveals no evidence of dynamic ST segment change.  Case discussed with Dr. Deforest Hoyles with cardiology that agrees this is unlikely to be secondary to plaque rupture.  He recommends continued work-up with echocardiogram in the morning.  He states that if repeat CT angiogram of the chest reveals no evidence of pulmonary embolism and echocardiogram reveals no evidence of right heart strain and cardiology should be formally consulted.    Mixed hyperlipidemia   Continue home regimen of statin therapy    Essential hypertension   Continue home regimen of antihypertensives  Monitor for episodic hypotension considering concerns for pulmonary embolism    GERD without esophagitis  Continue home regimen of H2 blocker.    Code Status:  Full  code Family Communication: Deferred  Status is: Inpatient  Remains inpatient appropriate because:Ongoing diagnostic testing needed not appropriate for outpatient work up, IV treatments appropriate due to intensity of illness or inability to take PO and Inpatient level of care appropriate due to severity of illness   Dispo: The patient is from: Home              Anticipated d/c is to: Home              Anticipated d/c date is: > 3 days              Patient currently is not medically stable to d/c.        Marinda Elk MD [facility] Pager [phone]  If 7PM-7AM, please contact night-coverage [email or web address] Use universal [place] password for that web site. If you do not have the password, please call the hospital operator.  10/14/2019, 4:00 AM

## 2019-10-14 NOTE — Progress Notes (Signed)
   10/14/19 0058  Assess: MEWS Score  Temp 98 F (36.7 C)  BP (!) 134/94  Pulse Rate (!) 124  ECG Heart Rate (!) 126  Resp 19  Level of Consciousness Alert  SpO2 99 %  O2 Device Nasal Cannula  O2 Flow Rate (L/min) 3 L/min  Assess: MEWS Score  MEWS Temp 0  MEWS Systolic 0  MEWS Pulse 2  MEWS RR 0  MEWS LOC 0  MEWS Score 2  MEWS Score Color Yellow  Assess: if the MEWS score is Yellow or Red  Were vital signs taken at a resting state? Yes  Focused Assessment Documented focused assessment  Early Detection of Sepsis Score *See Row Information* Medium  MEWS guidelines implemented *See Row Information* Yes  Treat  MEWS Interventions Escalated (See documentation below)  Take Vital Signs  Increase Vital Sign Frequency  Yellow: Q 2hr X 2 then Q 4hr X 2, if remains yellow, continue Q 4hrs  Escalate  MEWS: Escalate Yellow: discuss with charge nurse/RN and consider discussing with provider and RRT  Notify: Charge Nurse/RN  Name of Charge Nurse/RN Notified [place], RN  Date Charge Nurse/RN Notified 10/14/19  Time Charge Nurse/RN Notified 0100  Notify: Provider  Provider Name/Title Leafy Half, MD  Date Provider Notified 10/14/19  Time Provider Notified 0120  Notification Type Page  Notification Reason Change in status  Response See new orders  Date of Provider Response 10/14/19  Time of Provider Response 0130  Document  Patient Outcome Other (Comment) (New orders placed and plan of care altered)  Progress note created (see row info) Yes   Bari Edward, RN

## 2019-10-14 NOTE — Progress Notes (Signed)
Patient arrived on the unit stating that he was pain free with no distress in breathing; extremely diaphoretic with extremities cool and clammy with LLE posterior with an area of warmth. Peripheral pulses +1. [place], MD paged. Awaiting orders, will continue to monitor.   Bari Edward, RN

## 2019-10-15 ENCOUNTER — Inpatient Hospital Stay (HOSPITAL_COMMUNITY): Payer: Managed Care, Other (non HMO)

## 2019-10-15 DIAGNOSIS — I82402 Acute embolism and thrombosis of unspecified deep veins of left lower extremity: Secondary | ICD-10-CM

## 2019-10-15 DIAGNOSIS — I82409 Acute embolism and thrombosis of unspecified deep veins of unspecified lower extremity: Secondary | ICD-10-CM

## 2019-10-15 LAB — COMPREHENSIVE METABOLIC PANEL
ALT: 84 U/L — ABNORMAL HIGH (ref 0–44)
AST: 64 U/L — ABNORMAL HIGH (ref 15–41)
Albumin: 2.9 g/dL — ABNORMAL LOW (ref 3.5–5.0)
Alkaline Phosphatase: 66 U/L (ref 38–126)
Anion gap: 8 (ref 5–15)
BUN: 11 mg/dL (ref 6–20)
CO2: 24 mmol/L (ref 22–32)
Calcium: 8.5 mg/dL — ABNORMAL LOW (ref 8.9–10.3)
Chloride: 103 mmol/L (ref 98–111)
Creatinine, Ser: 0.93 mg/dL (ref 0.61–1.24)
GFR calc Af Amer: >60 mL/min (ref >60)
GFR calc non Af Amer: >60 mL/min (ref >60)
Glucose, Bld: 109 mg/dL — ABNORMAL HIGH (ref 70–99)
Potassium: 3.7 mmol/L (ref 3.5–5.1)
Sodium: 135 mmol/L (ref 135–145)
Total Bilirubin: 1.1 mg/dL (ref 0.3–1.2)
Total Protein: 6.9 g/dL (ref 6.5–8.1)

## 2019-10-15 LAB — CBC WITH DIFFERENTIAL/PLATELET
Abs Immature Granulocytes: 0.05 10*3/uL (ref 0.00–0.07)
Basophils Absolute: 0.1 10*3/uL (ref 0.0–0.1)
Basophils Relative: 1 %
Eosinophils Absolute: 0.3 10*3/uL (ref 0.0–0.5)
Eosinophils Relative: 4 %
HCT: 40.5 % (ref 39.0–52.0)
Hemoglobin: 13.2 g/dL (ref 13.0–17.0)
Immature Granulocytes: 1 %
Lymphocytes Relative: 42 %
Lymphs Abs: 2.7 10*3/uL (ref 0.7–4.0)
MCH: 28.3 pg (ref 26.0–34.0)
MCHC: 32.6 g/dL (ref 30.0–36.0)
MCV: 86.7 fL (ref 80.0–100.0)
Monocytes Absolute: 0.6 10*3/uL (ref 0.1–1.0)
Monocytes Relative: 9 %
Neutro Abs: 2.8 10*3/uL (ref 1.7–7.7)
Neutrophils Relative %: 43 %
Platelets: 168 10*3/uL (ref 150–400)
RBC: 4.67 MIL/uL (ref 4.22–5.81)
RDW: 14 % (ref 11.5–15.5)
WBC: 6.5 10*3/uL (ref 4.0–10.5)
nRBC: 0 % (ref 0.0–0.2)

## 2019-10-15 LAB — APTT
aPTT: 56 seconds — ABNORMAL HIGH (ref 24–36)
aPTT: 48 seconds — ABNORMAL HIGH (ref 24–36)
aPTT: 53 seconds — ABNORMAL HIGH (ref 24–36)

## 2019-10-15 LAB — MAGNESIUM: Magnesium: 1.9 mg/dL (ref 1.7–2.4)

## 2019-10-15 LAB — HEPARIN LEVEL (UNFRACTIONATED): Heparin Unfractionated: 0.56 IU/mL (ref 0.30–0.70)

## 2019-10-15 MED ORDER — ANGIOPLASTY BOOK
Freq: Once | Status: AC
  Filled 2019-10-15: qty 1

## 2019-10-15 MED ORDER — LORAZEPAM 1 MG PO TABS
1.0000 mg | ORAL_TABLET | Freq: Four times a day (QID) | ORAL | Status: DC | PRN
  Administered 2019-10-15: 1 mg via ORAL
  Filled 2019-10-15: qty 1

## 2019-10-15 NOTE — Progress Notes (Addendum)
ANTICOAGULATION CONSULT NOTE  Pharmacy Consult for Heparin Indication: pulmonary embolus and DVT  Allergies  Allergen Reactions  . Lisinopril Cough    Patient Measurements: Height: 6\' 1"  (185.4 cm) Weight: (!) 144.1 kg (317 lb 9.6 oz) IBW/kg (Calculated) : 79.9 Heparin Dosing Weight: 114.1 kg   Vital Signs: Temp: 98.9 F (37.2 C) (07/19 0649) Temp Source: Oral (07/19 0649) BP: 124/85 (07/19 0649) Pulse Rate: 88 (07/18 1953)  Labs: Recent Labs    10/13/19 1619 10/13/19 1619 10/13/19 2204 10/14/19 0013 10/14/19 0542 10/14/19 1202 10/15/19 0512  HGB 14.9   < >  --   --  14.3  --  13.2  HCT 44.6  --   --   --  42.9  --  40.5  PLT 165  --   --   --  145*  --  168  APTT  --   --   --   --  84* 80* 56*  HEPARINUNFRC  --   --   --   --  >2.20*  --  0.56  CREATININE 0.90  --   --   --   --   --  0.93  TROPONINIHS  --   --  524* 1,444*  --   --   --    < > = values in this interval not displayed.    Estimated Creatinine Clearance: 153 mL/min (by C-G formula based on SCr of 0.93 mg/dL).   Medical History: Past Medical History:  Diagnosis Date  . Essential hypertension 10/14/2019  . GERD without esophagitis 10/14/2019  . High cholesterol   . Hypertension   . Mixed hyperlipidemia 10/14/2019    Medications:  Scheduled:  . amLODipine  2.5 mg Oral Daily  . famotidine  20 mg Oral BID  . ondansetron (ZOFRAN) IV  4 mg Intravenous Once  . simvastatin  20 mg Oral q1800    Assessment: Patient is a 87 yom that presented to the ED for a 55 of his LLE. The patient was found to have a DVT in his LLE after [place]. Plan was to d/c patient on a DOAC and have him f/u outpatient. When the patient went to leave he became hypoxic and diaphoretic with s/s of a PE. Decision to admit the patient was made and pharmacy has been asked to dose heparin at this time.   Patient did receive a one time dose of Xarelto on 7/17 at 1832.  Heparin level down to 0.56, slightly falsely elevated still  from effects of Xarelto dose when compared to aPTT which is subtherapeutic at 56, on heparin infusion at 1950 units/hr. Hgb 13.2, plt 168. No s/sx of bleeding or infusion issues per nursing. Level drawn appropriately from opposite arm.  Goal of Therapy:  Heparin level 0.3-0.7 units/ml aPTT 66-102 seconds Monitor platelets by anticoagulation protocol: Yes   Plan:  Increase heparin IV to 2100 units/hr  Recheck 6 hr aPTT Obtain daily heparin level and PTT Monitor for signs and symptoms of bleeding  8/17, PharmD, BCCCP Clinical Pharmacist  Phone: [phone] 10/15/2019 7:34 AM  Please check AMION for all [facility] Pharmacy phone numbers After 10:00 PM, call Main Pharmacy [phone]   ADDENDUM Confirmed heparin was increased to 2100 units/hr this morning. APTT came back subtherapeutic at 46. Bag was empty when went to evaluate infusion - unsure for how long it was out. Discussed with RN and will continue heparin at 2100 units/hr with new bag and get level in 6 hrs.  Sherron Monday, PharmD, BCCCP Clinical Pharmacist  Phone: [phone] 10/15/2019 2:41 PM  Please check AMION for all [facility] Pharmacy phone numbers After 10:00 PM, call Main Pharmacy [phone]

## 2019-10-15 NOTE — Consult Note (Signed)
  Referring Physician: [facility]  Patient name: Christian Stevens MRN: [id number] DOB: 04/23/1975 Sex: male  REASON FOR CONSULT: left leg DVT  HPI: Christian Stevens is a 44 y.o. male, who was in his usual state of health until he recently developed left leg swelling and pain.  He had a venous duplex performed which showed a left leg DVT primarily popliteal and tibial veins.  There was no femoral or popliteal component.  He subsequently had a pulmonary embolus.  He currently is still on heparin.  He has no prior history of DVT.  He has no recent trauma.  He has no family history of DVT.  He does not recall being more sedentary than usual.  Patient states he had fairly dramatic swelling in his left leg initially but this has improved considerably.  Past Medical History:  Diagnosis Date  . Essential hypertension 10/14/2019  . GERD without esophagitis 10/14/2019  . High cholesterol   . Hypertension   . Mixed hyperlipidemia 10/14/2019   History reviewed. No pertinent surgical history.  Family History  Problem Relation Age of Onset  . Hypertension Father   . Hyperlipidemia Father     SOCIAL HISTORY: Social History   Socioeconomic History  . Marital status: Single    Spouse name: Not on file  . Number of children: Not on file  . Years of education: Not on file  . Highest education level: Not on file  Occupational History  . Not on file  Tobacco Use  . Smoking status: Never Smoker  . Smokeless tobacco: Never Used  Vaping Use  . Vaping Use: Never used  Substance and Sexual Activity  . Alcohol use: No  . Drug use: No  . Sexual activity: Not on file  Other Topics Concern  . Not on file  Social History Narrative  . Not on file   Social Determinants of Health   Financial Resource Strain:   . Difficulty of Paying Living Expenses:   Food Insecurity:   . Worried About Running Out of Food in the Last Year:   . Ran Out of Food in the Last Year:   Transportation Needs:   . Lack  of Transportation (Medical):   . Lack of Transportation (Non-Medical):   Physical Activity:   . Days of Exercise per Week:   . Minutes of Exercise per Session:   Stress:   . Feeling of Stress :   Social Connections:   . Frequency of Communication with Friends and Family:   . Frequency of Social Gatherings with Friends and Family:   . Attends Religious Services:   . Active Member of Clubs or Organizations:   . Attends Club or Organization Meetings:   . Marital Status:   Intimate Partner Violence:   . Fear of Current or Ex-Partner:   . Emotionally Abused:   . Physically Abused:   . Sexually Abused:     Allergies  Allergen Reactions  . Lisinopril Cough    Current Facility-Administered Medications  Medication Dose Route Frequency Provider Last Rate Last Admin  . 0.9 %  sodium chloride infusion   Intravenous PRN Shalhoub, George J, MD   Paused at 10/14/19 0535  . acetaminophen (TYLENOL) tablet 650 mg  650 mg Oral Q6H PRN Shalhoub, George J, MD       Or  . acetaminophen (TYLENOL) suppository 650 mg  650 mg Rectal Q6H PRN Shalhoub, George J, MD      . albuterol (PROVENTIL) (2.5 MG/3ML) 0.083% nebulizer solution 2.5   mg  2.5 mg Nebulization Q4H PRN Marinda Elk, MD      . amLODipine (NORVASC) tablet 2.5 mg  2.5 mg Oral Daily Shalhoub, Deno Lunger, MD   2.5 mg at 10/14/19 0855  . famotidine (PEPCID) tablet 20 mg  20 mg Oral BID Marinda Elk, MD   20 mg at 10/14/19 2211  . heparin ADULT infusion 100 units/mL (25000 units/269mL sodium chloride 0.45%)  2,100 Units/hr Intravenous Continuous Burnadette Pop, MD 19.5 mL/hr at 10/15/19 0032 1,950 Units/hr at 10/15/19 0032  . ondansetron (ZOFRAN) injection 4 mg  4 mg Intravenous Once Shalhoub, Deno Lunger, MD      . ondansetron [facility]) tablet 4 mg  4 mg Oral Q6H PRN Shalhoub, Deno Lunger, MD       Or  . ondansetron (ZOFRAN) injection 4 mg  4 mg Intravenous Q6H PRN Shalhoub, Deno Lunger, MD      . polyethylene glycol (MIRALAX / GLYCOLAX) packet  17 g  17 g Oral Daily PRN Shalhoub, Deno Lunger, MD      . simvastatin (ZOCOR) tablet 20 mg  20 mg Oral q1800 Marinda Elk, MD   20 mg at 10/14/19 1712    ROS:   General:  No weight loss, Fever, chills   Physical Examination  Vitals:   10/14/19 1654 10/14/19 1712 10/14/19 1953 10/15/19 0649  BP:   118/75 124/85  Pulse: 95 94 88   Resp:    20  Temp:   99 F (37.2 C) 98.9 F (37.2 C)  TempSrc:   Oral Oral  SpO2: 93% 96% 97%   Weight:    (!) 144.1 kg  Height:        Body mass index is 41.9 kg/m.  General:  Alert and oriented, no acute distress HEENT: Normal Neck: No JVD Cardiac: Regular Rate and Rhythm Abdomen: Soft, non-tender, non-distended, no mass, obese Skin: No rash, no ulcer Extremity Pulses:  2+ radial, brachial, femoral, dorsalis pedis, posterior tibial pulses bilaterally Musculoskeletal: No deformity left leg edema primarily from the knee down to the foot approximately 5 to 10% larger than the right leg no real edema in the left thigh  Neurologic: Upper and lower extremity motor 5/5 and symmetric  DATA:  Ultrasound left leg thrombus posterior tibial and tibioperoneal trunk vein left leg no evidence of DVT popliteal femoral vein  ASSESSMENT: Patient with recent left leg DVT and pulmonary embolus.  Ultrasound shows DVT confined to the tibial veins.  However, he has a fairly large thrombus burden in his pulmonary circulation.  In light of this I believe he also needs an ileocaval duplex to make sure that the iliac vein was not his source or potentially may Thurner type syndrome.  If he does not have significant thrombus in the abdomen then probably no real role for thrombolysis as his symptoms are rapidly improving and he has only calf vein DVT.   PLAN: I will follow up with the patient after his ileocaval ultrasound.  Continue anticoagulation  Fabienne Bruns, MD [facility] Office: [phone] Pager: [phone]

## 2019-10-15 NOTE — H&P (View-Only) (Signed)
Referring Physician: [facility]  Patient name: Christian Stevens MRN: [id number] DOB: 01/29/1976 Sex: male  REASON FOR CONSULT: left leg DVT  HPI: Christian Stevens is a 44 y.o. male, who was in his usual state of health until he recently developed left leg swelling and pain.  He had a venous duplex performed which showed a left leg DVT primarily popliteal and tibial veins.  There was no femoral or popliteal component.  He subsequently had a pulmonary embolus.  He currently is still on heparin.  He has no prior history of DVT.  He has no recent trauma.  He has no family history of DVT.  He does not recall being more sedentary than usual.  Patient states he had fairly dramatic swelling in his left leg initially but this has improved considerably.  Past Medical History:  Diagnosis Date  . Essential hypertension 10/14/2019  . GERD without esophagitis 10/14/2019  . High cholesterol   . Hypertension   . Mixed hyperlipidemia 10/14/2019   History reviewed. No pertinent surgical history.  Family History  Problem Relation Age of Onset  . Hypertension Father   . Hyperlipidemia Father     SOCIAL HISTORY: Social History   Socioeconomic History  . Marital status: Single    Spouse name: Not on file  . Number of children: Not on file  . Years of education: Not on file  . Highest education level: Not on file  Occupational History  . Not on file  Tobacco Use  . Smoking status: Never Smoker  . Smokeless tobacco: Never Used  Vaping Use  . Vaping Use: Never used  Substance and Sexual Activity  . Alcohol use: No  . Drug use: No  . Sexual activity: Not on file  Other Topics Concern  . Not on file  Social History Narrative  . Not on file   Social Determinants of Health   Financial Resource Strain:   . Difficulty of Paying Living Expenses:   Food Insecurity:   . Worried About Programme researcher, broadcasting/film/video in the Last Year:   . Barista in the Last Year:   Transportation Needs:   . Sales promotion account executive (Medical):   Marland Kitchen Lack of Transportation (Non-Medical):   Physical Activity:   . Days of Exercise per Week:   . Minutes of Exercise per Session:   Stress:   . Feeling of Stress :   Social Connections:   . Frequency of Communication with Friends and Family:   . Frequency of Social Gatherings with Friends and Family:   . Attends Religious Services:   . Active Member of Clubs or Organizations:   . Attends Banker Meetings:   Marland Kitchen Marital Status:   Intimate Partner Violence:   . Fear of Current or Ex-Partner:   . Emotionally Abused:   Marland Kitchen Physically Abused:   . Sexually Abused:     Allergies  Allergen Reactions  . Lisinopril Cough    Current Facility-Administered Medications  Medication Dose Route Frequency Provider Last Rate Last Admin  . 0.9 %  sodium chloride infusion   Intravenous PRN Shalhoub, Deno Lunger, MD   Paused at 10/14/19 0535  . acetaminophen (TYLENOL) tablet 650 mg  650 mg Oral Q6H PRN Shalhoub, Deno Lunger, MD       Or  . acetaminophen (TYLENOL) suppository 650 mg  650 mg Rectal Q6H PRN Shalhoub, Deno Lunger, MD      . albuterol (PROVENTIL) (2.5 MG/3ML) 0.083% nebulizer solution 2.5  mg  2.5 mg Nebulization Q4H PRN Marinda Elk, MD      . amLODipine (NORVASC) tablet 2.5 mg  2.5 mg Oral Daily Shalhoub, Deno Lunger, MD   2.5 mg at 10/14/19 0855  . famotidine (PEPCID) tablet 20 mg  20 mg Oral BID Marinda Elk, MD   20 mg at 10/14/19 2211  . heparin ADULT infusion 100 units/mL (25000 units/269mL sodium chloride 0.45%)  2,100 Units/hr Intravenous Continuous Burnadette Pop, MD 19.5 mL/hr at 10/15/19 0032 1,950 Units/hr at 10/15/19 0032  . ondansetron (ZOFRAN) injection 4 mg  4 mg Intravenous Once Shalhoub, Deno Lunger, MD      . ondansetron [facility]) tablet 4 mg  4 mg Oral Q6H PRN Shalhoub, Deno Lunger, MD       Or  . ondansetron (ZOFRAN) injection 4 mg  4 mg Intravenous Q6H PRN Shalhoub, Deno Lunger, MD      . polyethylene glycol (MIRALAX / GLYCOLAX) packet  17 g  17 g Oral Daily PRN Shalhoub, Deno Lunger, MD      . simvastatin (ZOCOR) tablet 20 mg  20 mg Oral q1800 Marinda Elk, MD   20 mg at 10/14/19 1712    ROS:   General:  No weight loss, Fever, chills   Physical Examination  Vitals:   10/14/19 1654 10/14/19 1712 10/14/19 1953 10/15/19 0649  BP:   118/75 124/85  Pulse: 95 94 88   Resp:    20  Temp:   99 F (37.2 C) 98.9 F (37.2 C)  TempSrc:   Oral Oral  SpO2: 93% 96% 97%   Weight:    (!) 144.1 kg  Height:        Body mass index is 41.9 kg/m.  General:  Alert and oriented, no acute distress HEENT: Normal Neck: No JVD Cardiac: Regular Rate and Rhythm Abdomen: Soft, non-tender, non-distended, no mass, obese Skin: No rash, no ulcer Extremity Pulses:  2+ radial, brachial, femoral, dorsalis pedis, posterior tibial pulses bilaterally Musculoskeletal: No deformity left leg edema primarily from the knee down to the foot approximately 5 to 10% larger than the right leg no real edema in the left thigh  Neurologic: Upper and lower extremity motor 5/5 and symmetric  DATA:  Ultrasound left leg thrombus posterior tibial and tibioperoneal trunk vein left leg no evidence of DVT popliteal femoral vein  ASSESSMENT: Patient with recent left leg DVT and pulmonary embolus.  Ultrasound shows DVT confined to the tibial veins.  However, he has a fairly large thrombus burden in his pulmonary circulation.  In light of this I believe he also needs an ileocaval duplex to make sure that the iliac vein was not his source or potentially may Thurner type syndrome.  If he does not have significant thrombus in the abdomen then probably no real role for thrombolysis as his symptoms are rapidly improving and he has only calf vein DVT.   PLAN: I will follow up with the patient after his ileocaval ultrasound.  Continue anticoagulation  Fabienne Bruns, MD [facility] Office: [phone] Pager: [phone]

## 2019-10-15 NOTE — Progress Notes (Signed)
ANTICOAGULATION CONSULT NOTE  Pharmacy Consult for Heparin Indication: pulmonary embolus and DVT  Allergies  Allergen Reactions  . Lisinopril Cough    Patient Measurements: Height: 6\' 1"  (185.4 cm) Weight: (!) 144.1 kg (317 lb 9.6 oz) IBW/kg (Calculated) : 79.9 Heparin Dosing Weight: 114.1 kg   Vital Signs: Temp: 99.3 F (37.4 C) (07/19 2106) Temp Source: Oral (07/19 2106) BP: 132/87 (07/19 2106) Pulse Rate: 97 (07/19 2106)  Labs: Recent Labs    10/13/19 1619 10/13/19 1619 10/13/19 2204 10/14/19 0013 10/14/19 0542 10/14/19 1202 10/15/19 0512 10/15/19 1321 10/15/19 2047  HGB 14.9   < >  --   --  14.3  --  13.2  --   --   HCT 44.6  --   --   --  42.9  --  40.5  --   --   PLT 165  --   --   --  145*  --  168  --   --   APTT  --   --   --   --  84*   < > 56* 48* 53*  HEPARINUNFRC  --   --   --   --  >2.20*  --  0.56  --   --   CREATININE 0.90  --   --   --   --   --  0.93  --   --   TROPONINIHS  --   --  524* 1,444*  --   --   --   --   --    < > = values in this interval not displayed.    Estimated Creatinine Clearance: 153 mL/min (by C-G formula based on SCr of 0.93 mg/dL).   Medical History: Past Medical History:  Diagnosis Date  . Essential hypertension 10/14/2019  . GERD without esophagitis 10/14/2019  . High cholesterol   . Hypertension   . Mixed hyperlipidemia 10/14/2019    Medications:  Scheduled:  . amLODipine  2.5 mg Oral Daily  . famotidine  20 mg Oral BID  . ondansetron (ZOFRAN) IV  4 mg Intravenous Once  . simvastatin  20 mg Oral q1800    Assessment: Patient is a 61 yom that presented to the ED for a 55 of his LLE. The patient was found to have a DVT in his LLE after [place]. Plan was to d/c patient on a DOAC and have him f/u outpatient. When the patient went to leave he became hypoxic and diaphoretic with s/s of a PE. Decision to admit the patient was made and pharmacy has been asked to dose heparin at this time.   Patient did receive a one time  dose of Xarelto on 7/17 at 1832.  aPTT which is subtherapeutic at 53, on heparin infusion at 2100 units/hr. Hgb 13.2, plt 168. No s/sx of bleeding or infusion issues per nursing.  Goal of Therapy:  Heparin level 0.3-0.7 units/ml aPTT 66-102 seconds Monitor platelets by anticoagulation protocol: Yes   Plan:  Increase heparin IV to 2300 units/hr  Recheck 6 hr aPTT Obtain daily heparin level and PTT Monitor for signs and symptoms of bleeding  8/17, PharmD, [facility] Clinical Pharmacist Please see AMION for all Pharmacists' Contact Phone Numbers 10/15/2019, 9:33 PM

## 2019-10-15 NOTE — Progress Notes (Signed)
   I reviewed his ileocaval duplex which demonstrates thrombus to the level of the IVC and occlusive in his common external leg veins.  We will proceed with left lower extremity venography with intravascular ultrasound probable thrombectomy and possible stenting tomorrow.  I have discussed this with the patient he will be n.p.o. past midnight.  Lemar Livings, MD

## 2019-10-15 NOTE — Plan of Care (Signed)
  Problem: Education: Goal: Knowledge of General Education information will improve Description: Including pain rating scale, medication(s)/side effects and non-pharmacologic comfort measures Outcome: Progressing   Problem: Clinical Measurements: Goal: Ability to maintain clinical measurements within normal limits will improve Outcome: Progressing   

## 2019-10-15 NOTE — Progress Notes (Signed)
IVC/iliac vein duplex completed. Refer to "CV Proc" under chart review to view preliminary results.  Preliminary results discussed with Dr. Darrick Penna.  10/15/2019 9:44 AM Eula Fried., MHA, RVT, RDCS, RDMS

## 2019-10-15 NOTE — Progress Notes (Signed)
PROGRESS NOTE    Christian Stevens  ZOX:[id number] DOB: 1975/04/04 DOA: 10/13/2019 PCP: Georgann Housekeeper, MD   Brief Narrative:  Patient is a 44 year old male with history of hypertension, GERD, hyperlipidemia who presented with left lower extremity pain.  He developed pain 2 weeks ago before admission and also complained of significant swelling of the affected extremity.  On presentation, left lower extremity ultrasound revealed left posterior tibial DVT but subsequently developed severe shortness of breath and respiratory status in the emergency department.  Troponin was elevated.  Found to have acute PE.  Started on heparin drip.  Due to clot burden in the lower extremity, vascular surgery also consulted.  Ileal caval duplex showed thrombus to the level of IVC and occlusive in his common external iliac veins.  Vascular surgery planning for left lower extremity venography with intravascular ultrasound with possible thrombectomy and stenting tomorrow.  Assessment & Plan:   Principal Problem:   Acute pulmonary embolism (HCC) Active Problems:   Acute deep vein thrombosis (DVT) of tibial vein of left lower extremity (HCC)   Elevated troponin level not due myocardial infarction   Mixed hyperlipidemia   Essential hypertension   GERD without esophagitis   Acute PE/acute left lower extremity DVT: Presented with pain, swelling.  Venous Doppler was positive for acute DVT.  Initial CT chest was negative for PE but repeat was positive for submassive bilateral PE.  Started on heparin drip.  Still requiring 2 L of oxygen per minute. Echocardiogram showed significant right heart strain with severely reduced right ventricular systolic function, moderately enlarged right ventricular size.  Thrombolytic therapy not started due to hemodynamically stability. Unprovoked incident.  Likely he needs to continue on anticoagulation lifelong. Vascular surgery consulted for clot burden. Ileal caval duplex showed  thrombus to the level of IVC and occlusive in his common external iliac veins.  Vascular surgery planning for left lower extremity venography with intravascular ultrasound with possible thrombectomy and stenting tomorrow  Elevated troponin level: Secondary to supply demand ischemia secondary to PE.  Denies any chest pain.  Echocardiogram showed ejection fraction of 55%.  Hyperlipidemia: Continue statin  Hypertension: Continue monitor blood pressure.  Continue home regimen  GERD: Continue H2 blocker.  Morbid obesity: BMI 41.9         DVT prophylaxis: Heparin drip Code Status: Full Family Communication: None present at the bedside Status is: Inpatient  Remains inpatient appropriate because:Hemodynamically unstable   Dispo: The patient is from: Home              Anticipated d/c is to: Home              Anticipated d/c date is: 2 days              Patient currently is not medically stable to d/c.     Consultants: Vascular surgery  Procedures: None  Antimicrobials:  Anti-infectives (From admission, onward)   None      Subjective: Patient seen and examined at the bedside this morning.  Hemodynamically stable.  Feels better.  On 2 L of oxygen per minute.  Denies any shortness of breath or cough.  Left lower extremity swelling is much better today  Objective: Vitals:   10/14/19 1654 10/14/19 1712 10/14/19 1953 10/15/19 0649  BP:   118/75 124/85  Pulse: 95 94 88   Resp:    20  Temp:   99 F (37.2 C) 98.9 F (37.2 C)  TempSrc:   Oral Oral  SpO2: 93% 96% 97%  Weight:    (!) 144.1 kg  Height:        Intake/Output Summary (Last 24 hours) at 10/15/2019 0840 Last data filed at 10/15/2019 0700 Gross per 24 hour  Intake 1855.15 ml  Output 1050 ml  Net 805.15 ml   Filed Weights   10/13/19 1454 10/14/19 0507 10/15/19 0649  Weight: (!) 147.4 kg (!) 146.1 kg (!) 144.1 kg    Examination:  General exam: Appears calm and comfortable ,Not in  distress,obese HEENT:PERRL,Oral mucosa moist, Ear/Nose normal on gross exam Respiratory system: Bilateral equal air entry, normal vesicular breath sounds, no wheezes or crackles  Cardiovascular system: S1 & S2 heard, RRR. No JVD, murmurs, rubs, gallops or clicks. No pedal edema. Gastrointestinal system: Abdomen is nondistended, soft and nontender. No organomegaly or masses felt. Normal bowel sounds heard. Central nervous system: Alert and oriented. No focal neurological deficits. Extremities: Swelling of left lower extremity, no clubbing ,no cyanosis Skin: No rashes, lesions or ulcers,no icterus ,no pallor   Data Reviewed: I have personally reviewed following labs and imaging studies  CBC: Recent Labs  Lab 10/13/19 1619 10/14/19 0542 10/15/19 0512  WBC 11.3* 11.4* 6.5  NEUTROABS 5.1 7.1 2.8  HGB 14.9 14.3 13.2  HCT 44.6 42.9 40.5  MCV 84.3 85.3 86.7  PLT 165 145* 168   Basic Metabolic Panel: Recent Labs  Lab 10/13/19 1619 10/15/19 0512  NA 136 135  K 3.7 3.7  CL 103 103  CO2 22 24  GLUCOSE 119* 109*  BUN 10 11  CREATININE 0.90 0.93  CALCIUM 8.8* 8.5*  MG  --  1.9   GFR: Estimated Creatinine Clearance: 153 mL/min (by C-G formula based on SCr of 0.93 mg/dL). Liver Function Tests: Recent Labs  Lab 10/15/19 0512  AST 64*  ALT 84*  ALKPHOS 66  BILITOT 1.1  PROT 6.9  ALBUMIN 2.9*   No results for input(s): LIPASE, AMYLASE in the last 168 hours. No results for input(s): AMMONIA in the last 168 hours. Coagulation Profile: No results for input(s): INR, PROTIME in the last 168 hours. Cardiac Enzymes: No results for input(s): CKTOTAL, CKMB, CKMBINDEX, TROPONINI in the last 168 hours. BNP (last 3 results) No results for input(s): PROBNP in the last 8760 hours. HbA1C: No results for input(s): HGBA1C in the last 72 hours. CBG: No results for input(s): GLUCAP in the last 168 hours. Lipid Profile: No results for input(s): CHOL, HDL, LDLCALC, TRIG, CHOLHDL, LDLDIRECT  in the last 72 hours. Thyroid Function Tests: No results for input(s): TSH, T4TOTAL, FREET4, T3FREE, THYROIDAB in the last 72 hours. Anemia Panel: No results for input(s): VITAMINB12, FOLATE, FERRITIN, TIBC, IRON, RETICCTPCT in the last 72 hours. Sepsis Labs: No results for input(s): PROCALCITON, LATICACIDVEN in the last 168 hours.  Recent Results (from the past 240 hour(s))  SARS Coronavirus 2 by RT PCR (hospital order, performed in [facility] hospital lab) Nasopharyngeal Nasopharyngeal Swab     Status: None   Collection Time: 10/13/19  6:27 PM   Specimen: Nasopharyngeal Swab  Result Value Ref Range Status   SARS Coronavirus 2 NEGATIVE NEGATIVE Final    Comment: (NOTE) SARS-CoV-2 target nucleic acids are NOT DETECTED.  The SARS-CoV-2 RNA is generally detectable in upper and lower respiratory specimens during the acute phase of infection. The lowest concentration of SARS-CoV-2 viral copies this assay can detect is 250 copies / mL. A negative result does not preclude SARS-CoV-2 infection and should not be used as the sole basis for treatment or other patient  management decisions.  A negative result may occur with improper specimen collection / handling, submission of specimen other than nasopharyngeal swab, presence of viral mutation(s) within the areas targeted by this assay, and inadequate number of viral copies (<250 copies / mL). A negative result must be combined with clinical observations, patient history, and epidemiological information.  Fact Sheet for Patients:   BoilerBrush.com.cyhttps://www.fda.gov/media/136312/download  Fact Sheet for Healthcare Providers: https://pope.com/https://www.fda.gov/media/136313/download  This test is not yet approved or  cleared by the [place] FDA and has been authorized for detection and/or diagnosis of SARS-CoV-2 by FDA under an Emergency Use Authorization (EUA).  This EUA will remain in effect (meaning this test can be used) for the duration of the COVID-19  declaration under Section 564(b)(1) of the Act, 21 U.S.C. section 360bbb-3(b)(1), unless the authorization is terminated or revoked sooner.  Performed at [facility] [place], [address] [place], [place] [address]          Radiology Studies: CT ANGIO CHEST PE W OR WO CONTRAST  Result Date: 10/14/2019 CLINICAL DATA:  Clinical deterioration in patient with known deep venous thrombosis. Suspicion of pulmonary embolism. EXAM: CT ANGIOGRAPHY CHEST WITH CONTRAST TECHNIQUE: Multidetector CT imaging of the chest was performed using the standard protocol during bolus administration of intravenous contrast. Multiplanar CT image reconstructions and MIPs were obtained to evaluate the vascular anatomy. CONTRAST:  100mL OMNIPAQUE IOHEXOL 350 MG/ML SOLN COMPARISON:  Suboptimal exam of 1 day prior FINDINGS: Cardiovascular: The quality of this exam for evaluation of pulmonary embolism is moderate, but sufficient. Left lower lobe lobar and segmental pulmonary emboli identified. Segmental to subsegmental right-sided emboli. The RV-LV ratio is 5.8-3.7 cm=1.6. Bovine arch.  Mild cardiomegaly. Mediastinum/Nodes: No mediastinal or hilar adenopathy. Lungs/Pleura: No pleural fluid. 2 mm right upper lobe pulmonary nodules x2 on 52/6. Upper Abdomen: Hepatic steatosis. Normal imaged portions of the spleen, stomach, pancreas, gallbladder. Musculoskeletal: No acute osseous abnormality. Review of the MIP images confirms the above findings. IMPRESSION: 1. Left larger than right, moderate volume pulmonary emboli. Positive for acute PE with CT evidence of right heart strain (RV/LV Ratio = 1.6) consistent with at least submassive (intermediate risk) PE. The presence of right heart strain has been associated with an increased risk of morbidity and mortality. Please refer to the "PE Focused" order set in EPIC. 2. Tiny right upper lobe pulmonary nodules. No follow-up needed if patient is low-risk. Non-contrast chest CT can be  considered in 12 months if patient is high-risk. This recommendation follows the consensus statement: Guidelines for Management of Incidental Pulmonary Nodules Detected on CT Images: From the Fleischner Society 2017; Radiology 2017; 284:228-243. 3. Hepatic steatosis. These results will be called to the ordering clinician or representative by the Radiologist Assistant, and communication documented in the PACS or [employer] Dashboard. Electronically Signed   By: Jeronimo GreavesKyle  Talbot M.D.   On: 10/14/2019 08:02   CT Angio Chest PE W and/or Wo Contrast  Result Date: 10/13/2019 CLINICAL DATA:  Left leg DVT diagnosed today. Diaphoresis and tachycardia. EXAM: CT ANGIOGRAPHY CHEST WITH CONTRAST TECHNIQUE: Multidetector CT imaging of the chest was performed using the standard protocol during bolus administration of intravenous contrast. Multiplanar CT image reconstructions and MIPs were obtained to evaluate the vascular anatomy. CONTRAST:  100mL OMNIPAQUE IOHEXOL 350 MG/ML SOLN COMPARISON:  Chest radiographs dated 01/31/2013 FINDINGS: Cardiovascular: The pulmonary arteries are suboptimally visualized due to delayed timing of the contrast bolus and the large size of the patient. No gross pulmonary arterial filling defects are  seen. Branch vessel defects are difficult to exclude. Normal sized heart. Mediastinum/Nodes: No enlarged mediastinal, hilar, or axillary lymph nodes. Thyroid gland, trachea, and esophagus demonstrate no significant findings. Lungs/Pleura: Minimal bilateral dependent atelectasis. No pleural fluid. Upper Abdomen: Unremarkable. Musculoskeletal: Thoracic spine degenerative changes. Review of the MIP images confirms the above findings. IMPRESSION: 1. Suboptimally visualized pulmonary arteries due to delayed timing of the contrast bolus and the large size of the patient. No gross pulmonary arterial filling defects are seen. Branch vessel defects cannot be excluded. 2. Minimal bilateral dependent atelectasis.  Electronically Signed   By: Beckie Salts M.D.   On: 10/13/2019 17:52   US Venous Img Lower Unilateral Left  Result Date: 10/13/2019 CLINICAL DATA:  Left lower leg pain and swelling for 2 weeks. EXAM: LEFT LOWER EXTREMITY VENOUS DOPPLER ULTRASOUND TECHNIQUE: Gray-scale sonography with compression, as well as color and duplex ultrasound, were performed to evaluate the deep venous system(s) from the level of the common femoral vein through the popliteal and proximal calf veins. COMPARISON:  None. FINDINGS: VENOUS Normal compressibility of the common femoral, superficial femoral, and popliteal veins. Visualized portions of profunda femoral vein and great saphenous vein unremarkable. Doppler waveforms show normal direction of venous flow, normal respiratory plasticity and response to augmentation. There is lack of compression in the mid to distal left posterior tibial vein with no flow on color Doppler. The proximal aspect of the vein could not be visualized. Additionally there is some evidence of occlusive thrombus at the tibial peroneal trunk. The peroneal veins could not be visualized. Limited views of the contralateral common femoral vein are unremarkable. OTHER None. Limitations: none IMPRESSION: Positive for occlusive thrombus in the mid to distal posterior tibial vein in the left calf. There is also some evidence of thrombus at the tibial peroneal trunk in the calf. There is no evidence of thrombus in the common femoral through popliteal veins. These results will be called to the ordering clinician or representative by the Radiologist Assistant, and communication documented in the PACS or [employer]. Electronically Signed   By: Emmaline Kluver M.D.   On: 10/13/2019 16:09   ECHOCARDIOGRAM COMPLETE  Result Date: 10/14/2019    ECHOCARDIOGRAM REPORT   Patient Name:   Christian Stevens Date of Exam: 10/14/2019 Medical Rec #:  [id number]           Height:       73.0 in Accession #:    [id number]           Weight:       322.1 lb Date of Birth:  1975-12-09           BSA:          2.635 m Patient Age:    43 years            BP:           107/83 mmHg Patient Gender: M                   HR:           95 bpm. Exam Location:  Inpatient Procedure: 2D Echo Indications:    pulmonary embolus 415.19  History:        Patient has no prior history of Echocardiogram examinations.                 Risk Factors:Hypertension and Dyslipidemia.  Sonographer:    Delcie Roch Referring Phys: [id number] Deno Lunger [place] IMPRESSIONS  1. Left ventricular ejection  fraction, by estimation, is 55%%. The left ventricle has normal function. The left ventricle has no regional wall motion abnormalities. There is mild left ventricular hypertrophy. Left ventricular diastolic parameters were normal.  2. Right ventricular systolic function is severely reduced. The right ventricular size is moderately enlarged. There is normal pulmonary artery systolic pressure.  3. The mitral valve is normal in structure. Trivial mitral valve regurgitation.  4. The aortic valve is normal in structure. Aortic valve regurgitation is not visualized.  5. The inferior vena cava is dilated in size with >50% respiratory variability, suggesting right atrial pressure of 8 mmHg. FINDINGS  Left Ventricle: Left ventricular ejection fraction, by estimation, is 55%%. The left ventricle has normal function. The left ventricle has no regional wall motion abnormalities. The left ventricular internal cavity size was normal in size. There is mild  left ventricular hypertrophy. Left ventricular diastolic parameters were normal. Right Ventricle: The right ventricular size is moderately enlarged. Right vetricular wall thickness was not assessed. Right ventricular systolic function is severely reduced. There is normal pulmonary artery systolic pressure. The tricuspid regurgitant velocity is 2.21 m/s, and with an assumed right atrial pressure of 3 mmHg, the estimated right ventricular  systolic pressure is 22.5 mmHg. Left Atrium: Left atrial size was normal in size. Right Atrium: Right atrial size was normal in size. Pericardium: There is no evidence of pericardial effusion. Mitral Valve: The mitral valve is normal in structure. Trivial mitral valve regurgitation. Tricuspid Valve: The tricuspid valve is normal in structure. Tricuspid valve regurgitation is mild. Aortic Valve: The aortic valve is normal in structure. Aortic valve regurgitation is not visualized. Pulmonic Valve: The pulmonic valve was normal in structure. Pulmonic valve regurgitation is trivial. Aorta: The aortic root is normal in size and structure. Venous: The inferior vena cava is dilated in size with greater than 50% respiratory variability, suggesting right atrial pressure of 8 mmHg. IAS/Shunts: The interatrial septum was not assessed.  LEFT VENTRICLE PLAX 2D LVIDd:         5.00 cm  Diastology LVIDs:         3.70 cm  LV e' lateral:   13.30 cm/s LV PW:         1.00 cm  LV E/e' lateral: 4.6 LV IVS:        0.90 cm  LV e' medial:    8.92 cm/s LVOT diam:     2.40 cm  LV E/e' medial:  6.8 LV SV:         62 LV SV Index:   23 LVOT Area:     4.52 cm  RIGHT VENTRICLE            IVC RV S prime:     8.70 cm/s  IVC diam: 2.10 cm TAPSE (M-mode): 1.6 cm LEFT ATRIUM             Index       RIGHT ATRIUM           Index LA diam:        3.80 cm 1.44 cm/m  RA Area:     14.40 cm LA Vol (A2C):   37.5 ml 14.23 ml/m RA Volume:   34.20 ml  12.98 ml/m LA Vol (A4C):   34.5 ml 13.09 ml/m LA Biplane Vol: 37.6 ml 14.27 ml/m  AORTIC VALVE LVOT Vmax:   84.20 cm/s LVOT Vmean:  60.000 cm/s LVOT VTI:    0.136 m  AORTA Ao Root diam: 3.50 cm Ao Asc diam:  3.20 cm MITRAL VALVE               TRICUSPID VALVE MV Area (PHT): 4.36 cm    TV Peak grad:   18.7 mmHg MV Decel Time: 174 msec    TV Vmax:        2.16 m/s MV E velocity: 60.80 cm/s  TR Peak grad:   19.5 mmHg MV A velocity: 70.70 cm/s  TR Vmax:        221.00 cm/s MV E/A ratio:  0.86                             SHUNTS                            Systemic VTI:  0.14 m                            Systemic Diam: 2.40 cm Dietrich Pates MD Electronically signed by Dietrich Pates MD Signature Date/Time: 10/14/2019/12:04:27 PM    Final         Scheduled Meds: . amLODipine  2.5 mg Oral Daily  . famotidine  20 mg Oral BID  . ondansetron (ZOFRAN) IV  4 mg Intravenous Once  . simvastatin  20 mg Oral q1800   Continuous Infusions: . sodium chloride Stopped (10/14/19 0535)  . heparin 1,950 Units/hr (10/15/19 0032)     LOS: 1 day    Time spent: 25 mins.More than 50% of that time was spent in counseling and/or coordination of care.      Burnadette Pop, MD [facility] P7/19/2021, 8:40 AM

## 2019-10-16 ENCOUNTER — Encounter (HOSPITAL_COMMUNITY): Payer: Self-pay | Admitting: Vascular Surgery

## 2019-10-16 ENCOUNTER — Encounter (HOSPITAL_COMMUNITY)
Admission: EM | Disposition: A | Discharge status: Home / Self Care | Payer: Self-pay | Source: Home / Self Care | Attending: Internal Medicine

## 2019-10-16 DIAGNOSIS — I82402 Acute embolism and thrombosis of unspecified deep veins of left lower extremity: Secondary | ICD-10-CM

## 2019-10-16 HISTORY — PX: PERIPHERAL VASCULAR INTERVENTION: CATH118257

## 2019-10-16 HISTORY — PX: PERIPHERAL VASCULAR ATHERECTOMY: CATH118256

## 2019-10-16 HISTORY — PX: INTRAVASCULAR ULTRASOUND/IVUS: CATH118244

## 2019-10-16 HISTORY — PX: IVC VENOGRAPHY: CATH118301

## 2019-10-16 LAB — HEPARIN LEVEL (UNFRACTIONATED)
Heparin Unfractionated: 0.45 IU/mL (ref 0.30–0.70)
Heparin Unfractionated: 0.42 IU/mL (ref 0.30–0.70)

## 2019-10-16 LAB — APTT
aPTT: 67 seconds — ABNORMAL HIGH (ref 24–36)
aPTT: 176 seconds (ref 24–36)

## 2019-10-16 SURGERY — IVC VENOGRAPHY
Anesthesia: LOCAL

## 2019-10-16 MED ORDER — HEPARIN (PORCINE) IN NACL 1000-0.9 UT/500ML-% IV SOLN
INTRAVENOUS | Status: DC | PRN
  Administered 2019-10-16: 500 mL

## 2019-10-16 MED ORDER — MIDAZOLAM HCL 2 MG/2ML IJ SOLN
INTRAMUSCULAR | Status: AC
  Filled 2019-10-16: qty 2

## 2019-10-16 MED ORDER — LIDOCAINE HCL (PF) 1 % IJ SOLN
INTRAMUSCULAR | Status: AC
  Filled 2019-10-16: qty 30

## 2019-10-16 MED ORDER — SODIUM CHLORIDE 0.9% FLUSH
3.0000 mL | Freq: Two times a day (BID) | INTRAVENOUS | Status: DC
  Administered 2019-10-17: 3 mL via INTRAVENOUS

## 2019-10-16 MED ORDER — SODIUM CHLORIDE 0.9% FLUSH: 3.0000 mL | INTRAVENOUS | Status: DC | PRN

## 2019-10-16 MED ORDER — HEPARIN SODIUM (PORCINE) 1000 UNIT/ML IJ SOLN
INTRAMUSCULAR | Status: AC
  Filled 2019-10-16: qty 1

## 2019-10-16 MED ORDER — OXYCODONE HCL 5 MG PO TABS
5.0000 mg | ORAL_TABLET | ORAL | Status: DC | PRN
  Administered 2019-10-17: 5 mg via ORAL
  Filled 2019-10-16: qty 1

## 2019-10-16 MED ORDER — SODIUM CHLORIDE 0.9 % IV SOLN: 250.0000 mL | INTRAVENOUS | Status: DC | PRN

## 2019-10-16 MED ORDER — IODIXANOL 320 MG/ML IV SOLN
INTRAVENOUS | Status: DC | PRN
  Administered 2019-10-16: 40 mL

## 2019-10-16 MED ORDER — SODIUM CHLORIDE 0.9 % IV SOLN: INTRAVENOUS | Status: AC

## 2019-10-16 MED ORDER — HEPARIN SODIUM (PORCINE) 1000 UNIT/ML IJ SOLN
INTRAMUSCULAR | Status: DC | PRN
  Administered 2019-10-16: 10000 [IU] via INTRAVENOUS

## 2019-10-16 MED ORDER — MIDAZOLAM HCL 2 MG/2ML IJ SOLN
INTRAMUSCULAR | Status: DC | PRN
  Administered 2019-10-16: 1 mg via INTRAVENOUS

## 2019-10-16 MED ORDER — HEPARIN (PORCINE) 25000 UT/250ML-% IV SOLN
2300.0000 [IU]/h | INTRAVENOUS | Status: DC
  Administered 2019-10-16 – 2019-10-17 (×2): 2300 [IU]/h via INTRAVENOUS
  Filled 2019-10-16 (×2): qty 250

## 2019-10-16 MED ORDER — HYDRALAZINE HCL 20 MG/ML IJ SOLN: 5.0000 mg | INTRAMUSCULAR | Status: DC | PRN

## 2019-10-16 MED ORDER — SODIUM CHLORIDE 0.9 % IV SOLN: INTRAVENOUS | Status: DC

## 2019-10-16 MED ORDER — LIDOCAINE HCL (PF) 1 % IJ SOLN
INTRAMUSCULAR | Status: DC | PRN
  Administered 2019-10-16: 5 mL

## 2019-10-16 MED ORDER — FENTANYL CITRATE (PF) 100 MCG/2ML IJ SOLN
INTRAMUSCULAR | Status: AC
  Filled 2019-10-16: qty 2

## 2019-10-16 MED ORDER — ONDANSETRON HCL 4 MG/2ML IJ SOLN: 4.0000 mg | Freq: Four times a day (QID) | INTRAMUSCULAR | Status: DC | PRN

## 2019-10-16 MED ORDER — HEPARIN (PORCINE) IN NACL 1000-0.9 UT/500ML-% IV SOLN
INTRAVENOUS | Status: AC
  Filled 2019-10-16: qty 500

## 2019-10-16 MED ORDER — LABETALOL HCL 5 MG/ML IV SOLN: 10.0000 mg | INTRAVENOUS | Status: DC | PRN

## 2019-10-16 MED ORDER — ACETAMINOPHEN 325 MG PO TABS: 650.0000 mg | ORAL_TABLET | ORAL | Status: DC | PRN

## 2019-10-16 MED ORDER — MORPHINE SULFATE (PF) 2 MG/ML IV SOLN: 2.0000 mg | INTRAVENOUS | Status: DC | PRN

## 2019-10-16 MED ORDER — FENTANYL CITRATE (PF) 100 MCG/2ML IJ SOLN
INTRAMUSCULAR | Status: DC | PRN
  Administered 2019-10-16 (×2): 50 ug via INTRAVENOUS

## 2019-10-16 MED ORDER — ASPIRIN EC 81 MG PO TBEC
81.0000 mg | DELAYED_RELEASE_TABLET | Freq: Every day | ORAL | Status: DC
  Administered 2019-10-17: 81 mg via ORAL
  Filled 2019-10-16: qty 1

## 2019-10-16 SURGICAL SUPPLY — 13 items
BALLN ATHLETIS 12X60X75 (BALLOONS) ×3
BALLOON ATHLETIS 12X60X75 (BALLOONS) IMPLANT
CATH ANGIO 5F BER 100CM (CATHETERS) ×1 IMPLANT
CATH RETRIEVER CLOT 16MMX105CM (CATHETERS) ×1 IMPLANT
CATH VISIONS PV .035 IVUS (CATHETERS) ×1 IMPLANT
GLIDEWIRE ADV .035X260CM (WIRE) ×1 IMPLANT
KIT ENCORE 26 ADVANTAGE (KITS) ×1 IMPLANT
KIT MICROPUNCTURE NIT STIFF (SHEATH) ×1 IMPLANT
SHEATH CLOT RETRIEVER (SHEATH) ×1 IMPLANT
SHEATH PINNACLE 8F 10CM (SHEATH) ×1 IMPLANT
SHEATH PROBE COVER 6X72 (BAG) ×1 IMPLANT
STENT WALLSTENT 18X90X75 (Permanent Stent) ×1 IMPLANT
TRAY PV CATH (CUSTOM PROCEDURE TRAY) ×3 IMPLANT

## 2019-10-16 NOTE — Op Note (Signed)
    Patient name: Christian Stevens MRN: [id number] DOB: [birth year] Sex: male  10/16/2019 Pre-operative Diagnosis: dvt Post-operative diagnosis:  Same Surgeon:  Apolinar Junes C. Randie Heinz, MD Procedure Performed: 1.  Ultrasound-guided cannulation left small saphenous vein 2.  Left lower extremity and IVC venogram 3.  Intravascular ultrasound left popliteal, left femoral, left common femoral, left external and common iliac veins and IVC 4.  Mechanical thrombectomy of left popliteal, left femoral, left common femoral, left external and common iliac veins with Inari Clottriever 5.  Stent of left common and external iliac veins with 18 x 90 mm Wallstent 6.  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  He is indicated for venogram possible invention of the left lower extremity.  Findings: Small saphenous vein did appear to have sluggish flow but was patent.  This was cannulated.  From the popliteal vein all the way through the common iliac vein patient appeared to have dilated veins which had sluggish flow there was some evidence of webbing and also possible acute appearing thrombus.  At the confluence of the IVC the left common iliac vein measured 4 mm at its greatest diameter.  To have chronic appearing thrombus was occluded.  After mechanical thrombectomy and stenting we had no further thrombus.  We did not retrieve significant amounts of thrombus was likely mostly sluggish or rouleaux flow.  Stent greatest diameter 11 mm at the area of the May Thurner anatomy.   Procedure:  The patient was identified in the holding area and taken to Lab 8 where he was placed prone on the procedure table timeout was called.  He was sterilely prepped and draped in the left popliteal fossa.  Moderate sedation with fentanyl and Versed was administered and vital signs were monitored throughout the course.   Ultrasound was used to identify a patent small saphenous vein.  There is anesthetized 1% lidocaine cannulated micropuncture needle followed the wire sheath.  And images saved the permanent record.  We placed a Glidewire advantage followed by [place] sheath.  Glidewire advantage did stop at the level of the common iliac vein.  We used a bare catheter directed to the IVC confirmed intraluminal access with venogram.  Patient was fully heparinized at this time.  We exchanged for an IVUS catheter and performed intravascular ultrasound with the above findings.  We exchanged over the wire for the Inari Clottriever sheath.  We then performed mechanical thrombectomy from the common iliac vein all the way through the popliteal vein.  We made 3 separate passes there was only minimal clot retrieved.  We then used 12 mm balloon to angioplasty the common external leg veins.  There was a significant waist at the level of L4-L5.  We then repeated intravascular ultrasound primarily stented with an 18 x 90 mm Wallstent.  This was postdilated with 12 mm balloon.  Completion demonstrated flow from the popliteal vein all the way through the IVC.  This was confirmed with venography.  Satisfied we removed our wires.  We suture a bolster in place at the small saphenous access site and the sheath was removed.  He tolerated procedure without any complication.  Contrast: 40cc   Thoams Siefert C. Randie Heinz, MD [facility] Office: [phone] Pager: [phone]

## 2019-10-16 NOTE — Progress Notes (Signed)
ANTICOAGULATION CONSULT NOTE - Follow Up Consult  Pharmacy Consult for heparin Indication: DVT/PE  Allergies  Allergen Reactions  . Lisinopril Cough    Patient Measurements: Height: 6\' 1"  (185.4 cm) Weight: (!) 143.2 kg (315 lb 11.2 oz) IBW/kg (Calculated) : 79.9 Heparin Dosing Weight: 114.1 kg  Vital Signs: Temp: 98.8 F (37.1 C) (07/20 1937) Temp Source: Oral (07/20 1937) BP: 127/77 (07/20 1937) Pulse Rate: 87 (07/20 1937)  Labs: Recent Labs     0000 10/13/19 2204 10/14/19 0013 10/14/19 0542 10/14/19 1202 10/15/19 0512 10/15/19 1321 10/15/19 2047 10/16/19 0439 10/16/19 1213 10/16/19 1949  HGB  --   --   --  14.3  --  13.2  --   --   --   --   --   HCT  --   --   --  42.9  --  40.5  --   --   --   --   --   PLT  --   --   --  145*  --  168  --   --   --   --   --   APTT  --   --   --  84*   < > 56*   < > 53* 67* 176*  --   HEPARINUNFRC   < >  --   --  >2.20*  --  0.56  --   --  0.45  --  0.42  CREATININE  --   --   --   --   --  0.93  --   --   --   --   --   TROPONINIHS  --  524* 1,444*  --   --   --   --   --   --   --   --    < > = values in this interval not displayed.    Estimated Creatinine Clearance: 152.4 mL/min (by C-G formula based on SCr of 0.93 mg/dL).   Medications:  Scheduled:  . amLODipine  2.5 mg Oral Daily  . [START ON 10/17/2019] aspirin EC  81 mg Oral Daily  . famotidine  20 mg Oral BID  . ondansetron (ZOFRAN) IV  4 mg Intravenous Once  . simvastatin  20 mg Oral q1800  . sodium chloride flush  3 mL Intravenous Q12H   Infusions:  . sodium chloride Stopped (10/14/19 0535)  . sodium chloride 100 mL/hr at 10/16/19 0555  . sodium chloride    . heparin 2,300 Units/hr (10/16/19 1330)    Assessment: 44 year old male with DVT in LLE, went for lower extremity venography s/p mechanical thrombectomy + stent of L common/external iliac veins 7/20. Pt had one time dose of xarelto on 7/17 @1832 .  Pharmacy okay to restart heparin infusion after  procedure without bolus. Will restart previous rate given therapeutic APTT at 0.67 on 7/20 (heparin level came back therapeutic at 0.45- correlating). CBC stable on last check. No bleeding per nursing.   Heparin level came back therapeutic tonight. We will continue the same rate.   Goal of Therapy:  Heparin level 0.3-0.7 units/ml aPTT 66-102 seconds Monitor platelets by anticoagulation protocol: Yes   Plan:  Continue heparin 2300 units/hr  Obtain daily heparin level Monitor for s/s bleeding   , PharmD, BCIDP, AAHIVP, CPP Infectious Disease Pharmacist 10/16/2019 8:28 PM

## 2019-10-16 NOTE — Progress Notes (Signed)
ANTICOAGULATION CONSULT NOTE  Pharmacy Consult for Heparin Indication: pulmonary embolus and DVT  Allergies  Allergen Reactions  . Lisinopril Cough    Patient Measurements: Height: 6\' 1"  (185.4 cm) Weight: (!) 143.2 kg (315 lb 11.2 oz) IBW/kg (Calculated) : 79.9 Heparin Dosing Weight: 114.1 kg   Vital Signs: Temp: 98.9 F (37.2 C) (07/20 0527) Temp Source: Oral (07/20 0527) BP: 134/89 (07/20 0527) Pulse Rate: 88 (07/20 0527)  Labs: Recent Labs    10/13/19 1619 10/13/19 1619 10/13/19 2204 10/14/19 0013 10/14/19 0542 10/14/19 1202 10/15/19 0512 10/15/19 0512 10/15/19 1321 10/15/19 2047 10/16/19 0439  HGB 14.9   < >  --   --  14.3  --  13.2  --   --   --   --   HCT 44.6  --   --   --  42.9  --  40.5  --   --   --   --   PLT 165  --   --   --  145*  --  168  --   --   --   --   APTT  --   --   --   --  84*   < > 56*   < > 48* 53* 67*  HEPARINUNFRC  --   --   --   --  >2.20*  --  0.56  --   --   --  0.45  CREATININE 0.90  --   --   --   --   --  0.93  --   --   --   --   TROPONINIHS  --   --  524* 1,444*  --   --   --   --   --   --   --    < > = values in this interval not displayed.    Estimated Creatinine Clearance: 152.4 mL/min (by C-G formula based on SCr of 0.93 mg/dL).   Medical History: Past Medical History:  Diagnosis Date  . Essential hypertension 10/14/2019  . GERD without esophagitis 10/14/2019  . High cholesterol   . Hypertension   . Mixed hyperlipidemia 10/14/2019    Medications:  Scheduled:  . amLODipine  2.5 mg Oral Daily  . famotidine  20 mg Oral BID  . ondansetron (ZOFRAN) IV  4 mg Intravenous Once  . simvastatin  20 mg Oral q1800    Assessment: Patient is a 57 yom that presented to the ED for a 55 of his LLE. The patient was found to have a DVT in his LLE after [place]. Plan was to d/c patient on a DOAC and have him f/u outpatient. When the patient went to leave he became hypoxic and diaphoretic with s/s of a PE. Decision to admit the  patient was made and pharmacy has been asked to dose heparin at this time.   Patient did receive a one time dose of Xarelto on 7/17 at 1832.  Heparin level down to 0.56, slightly falsely elevated still from effects of Xarelto dose when compared to aPTT which is subtherapeutic at 56, on heparin infusion at 1950 units/hr. Hgb 13.2, plt 168. No s/sx of bleeding or infusion issues per nursing. Level drawn appropriately from opposite arm.  7/20 AM update:  APTT therapeutic this AM  Goal of Therapy:  Heparin level 0.3-0.7 units/ml aPTT 66-102 seconds Monitor platelets by anticoagulation protocol: Yes   Plan:  Cont heparin at 2300 units/hr 1200 aPTT/HL Obtain daily heparin level and  PTT Monitor for signs and symptoms of bleeding  Abran Duke, PharmD, BCPS Clinical Pharmacist Phone: [phone]

## 2019-10-16 NOTE — Interval H&P Note (Signed)
History and Physical Interval Note:  10/16/2019 9:04 AM  Christian Stevens  has presented today for surgery, with the diagnosis of dvt.  The various methods of treatment have been discussed with the patient and family. After consideration of risks, benefits and other options for treatment, the patient has consented to  Procedure(s): LOWER EXTREMITY VENOGRAPHY (N/A) as a surgical intervention.  The patient's history has been reviewed, patient examined, no change in status, stable for surgery.  I have reviewed the patient's chart and labs.  Questions were answered to the patient's satisfaction.     Lemar Livings

## 2019-10-16 NOTE — Progress Notes (Signed)
CRITICAL VALUE ALERT  Critical Value: APTT 176  Date & Time Notied:  10/16/19 1314  Provider Notified: Renford Dills, MD  Orders Received/Actions taken:

## 2019-10-16 NOTE — Progress Notes (Addendum)
PROGRESS NOTE    Christian MewChristopher Stevens  OZH:[id number] DOB: Jul 04, 1975 DOA: 10/13/2019 PCP: Georgann HousekeeperHusain, Karrar, MD   Brief Narrative:  Patient is a 44 year old male with history of hypertension, GERD, hyperlipidemia who presented with left lower extremity pain.  He developed pain 2 weeks ago before admission and also complained of significant swelling of the affected extremity.  On presentation, left lower extremity ultrasound revealed left posterior tibial DVT but subsequently developed severe shortness of breath and respiratory status in the emergency department.  Troponin was elevated.  Found to have acute PE.  Started on heparin drip.  Due to clot burden in the lower extremity, vascular surgery also consulted.  Ileal caval duplex showed thrombus to the level of IVC and occlusive in his common external iliac veins.  Vascular surgery planning for left lower extremity venography with intravascular ultrasound with possible thrombectomy and stenting tomorrow.  Assessment & Plan:   Principal Problem:   Acute pulmonary embolism (HCC) Active Problems:   Acute deep vein thrombosis (DVT) of tibial vein of left lower extremity (HCC)   Elevated troponin level not due myocardial infarction   Mixed hyperlipidemia   Essential hypertension   GERD without esophagitis   Acute PE/acute left lower extremity DVT: Presented with pain, swelling.  Venous Doppler was positive for acute DVT.  Initial CT chest was negative for PE but repeat was positive for submassive bilateral PE.  Started on heparin drip. Echocardiogram showed significant right heart strain with severely reduced right ventricular systolic function, moderately enlarged right ventricular size.  Thrombolytic therapy not started due to hemodynamically stability. Unprovoked incident. Needs to be on anticoagulation for at least 6 months or may be  lifelong. Vascular surgery consulted for clot burden. Ileal caval duplex showed thrombus to the level of IVC and  occlusive in his common external iliac veins.  Vascular surgery planning for left lower extremity venography with intravascular ultrasound with possible thrombectomy and stenting today. Plan is to continue IV heparin today, start on Eliquis tomorrow and discharge home.  Elevated troponin level: Secondary to supply demand ischemia secondary to PE.  Denies any chest pain.  Echocardiogram showed ejection fraction of 55%.  Hyperlipidemia: Continue statin  Hypertension: Continue monitor blood pressure.  Continue home regimen  GERD: Continue H2 blocker.  Suspicion for sleep apnea: Patient desaturates at sleep.  I have recommended him  an outpatient  sleep study.  Morbid obesity: BMI 41.9         DVT prophylaxis: Heparin drip Code Status: Full Family Communication: father present at the bedside Status is: Inpatient  Remains inpatient appropriate because:Hemodynamically unstable   Dispo: The patient is from: Home              Anticipated d/c is to: Home              Anticipated d/c date is: tomorrow              Patient currently is not medically stable to d/c. Needs IV heparin for today.  Also waiting for vascular surgery intervention    Consultants: Vascular surgery  Procedures: None  Antimicrobials:  Anti-infectives (From admission, onward)   None      Subjective: Patient seen and examined at the bedside this morning.  Hemodynamically stable.  Comfortable.  Denies any shortness of breath or chest pain.  Left lower extremity edema as slightly improved from yesterday.  Objective: Vitals:   10/15/19 1402 10/15/19 2106 10/15/19 2332 10/16/19 0527  BP: (!) 155/104 132/87 134/88 134/89  Pulse: (!) 104 97 (!) 103 88  Resp: 20  18 17   Temp: 98.6 F (37 C) 99.3 F (37.4 C) 99.7 F (37.6 C) 98.9 F (37.2 C)  TempSrc: Oral Oral Oral Oral  SpO2: 92% 96% 96% 96%  Weight:    (!) 143.2 kg  Height:        Intake/Output Summary (Last 24 hours) at 10/16/2019 0854 Last data  filed at 10/16/2019 0533 Gross per 24 hour  Intake 360 ml  Output 250 ml  Net 110 ml   Filed Weights   10/14/19 0507 10/15/19 0649 10/16/19 0527  Weight: (!) 146.1 kg (!) 144.1 kg (!) 143.2 kg    Examination:   General exam: Appears calm and comfortable ,Not in distress, morbidly obese HEENT:PERRL,Oral mucosa moist, Ear/Nose normal on gross exam Respiratory system: Bilateral equal air entry, normal vesicular breath sounds, no wheezes or crackles  Cardiovascular system: S1 & S2 heard, RRR. No JVD, murmurs, rubs, gallops or clicks. Gastrointestinal system: Abdomen is nondistended, soft and nontender. No organomegaly or masses felt. Normal bowel sounds heard. Central nervous system: Alert and oriented.  Extremities: Left lower extremity edema, no clubbing ,no cyanosis Skin: No rashes, lesions or ulcers,no icterus ,no pallor  Data Reviewed: I have personally reviewed following labs and imaging studies  CBC: Recent Labs  Lab 10/13/19 1619 10/14/19 0542 10/15/19 0512  WBC 11.3* 11.4* 6.5  NEUTROABS 5.1 7.1 2.8  HGB 14.9 14.3 13.2  HCT 44.6 42.9 40.5  MCV 84.3 85.3 86.7  PLT 165 145* 168   Basic Metabolic Panel: Recent Labs  Lab 10/13/19 1619 10/15/19 0512  NA 136 135  K 3.7 3.7  CL 103 103  CO2 22 24  GLUCOSE 119* 109*  BUN 10 11  CREATININE 0.90 0.93  CALCIUM 8.8* 8.5*  MG  --  1.9   GFR: Estimated Creatinine Clearance: 152.4 mL/min (by C-G formula based on SCr of 0.93 mg/dL). Liver Function Tests: Recent Labs  Lab 10/15/19 0512  AST 64*  ALT 84*  ALKPHOS 66  BILITOT 1.1  PROT 6.9  ALBUMIN 2.9*   No results for input(s): LIPASE, AMYLASE in the last 168 hours. No results for input(s): AMMONIA in the last 168 hours. Coagulation Profile: No results for input(s): INR, PROTIME in the last 168 hours. Cardiac Enzymes: No results for input(s): CKTOTAL, CKMB, CKMBINDEX, TROPONINI in the last 168 hours. BNP (last 3 results) No results for input(s): PROBNP in  the last 8760 hours. HbA1C: No results for input(s): HGBA1C in the last 72 hours. CBG: No results for input(s): GLUCAP in the last 168 hours. Lipid Profile: No results for input(s): CHOL, HDL, LDLCALC, TRIG, CHOLHDL, LDLDIRECT in the last 72 hours. Thyroid Function Tests: No results for input(s): TSH, T4TOTAL, FREET4, T3FREE, THYROIDAB in the last 72 hours. Anemia Panel: No results for input(s): VITAMINB12, FOLATE, FERRITIN, TIBC, IRON, RETICCTPCT in the last 72 hours. Sepsis Labs: No results for input(s): PROCALCITON, LATICACIDVEN in the last 168 hours.  Recent Results (from the past 240 hour(s))  SARS Coronavirus 2 by RT PCR (hospital order, performed in [facility] Main hospital lab) Nasopharyngeal Nasopharyngeal Swab     Status: None   Collection Time: 10/13/19  6:27 PM   Specimen: Nasopharyngeal Swab  Result Value Ref Range Status   SARS Coronavirus 2 NEGATIVE NEGATIVE Final    Comment: (NOTE) SARS-CoV-2 target nucleic acids are NOT DETECTED.  The SARS-CoV-2 RNA is generally detectable in upper and lower respiratory specimens during the acute phase of  infection. The lowest concentration of SARS-CoV-2 viral copies this assay can detect is 250 copies / mL. A negative result does not preclude SARS-CoV-2 infection and should not be used as the sole basis for treatment or other patient management decisions.  A negative result may occur with improper specimen collection / handling, submission of specimen other than nasopharyngeal swab, presence of viral mutation(s) within the areas targeted by this assay, and inadequate number of viral copies (<250 copies / mL). A negative result must be combined with clinical observations, patient history, and epidemiological information.  Fact Sheet for Patients:   BoilerBrush.com.cy  Fact Sheet for Healthcare Providers: https://pope.com/  This test is not yet approved or  cleared by the [place] FDA and has been authorized for detection and/or diagnosis of SARS-CoV-2 by FDA under an Emergency Use Authorization (EUA).  This EUA will remain in effect (meaning this test can be used) for the duration of the COVID-19 declaration under Section 564(b)(1) of the Act, 21 U.S.C. section 360bbb-3(b)(1), unless the authorization is terminated or revoked sooner.  Performed at [facility], [address] [place], [place] [address]          Radiology Studies: VAS [place] IVC/ILIAC (VENOUS ONLY)  Result Date: 10/15/2019 IVC/ILIAC STUDY Limitations: Air/bowel gas and obesity.  Comparison Study: No prior study Performing Technologist: Gertie Fey MHA, RDMS, RVT, RDCS  Examination Guidelines: A complete evaluation includes B-mode imaging, spectral Doppler, color Doppler, and power Doppler as needed of all accessible portions of each vessel. Bilateral testing is considered an integral part of a complete examination. Limited examinations for reoccurring indications may be performed as noted.  IVC/Iliac Findings: +----------+------+--------+---------------------+    IVC    PatentThrombus      Comments        +----------+------+--------+---------------------+ IVC Prox                Unable to visualize   +----------+------+--------+---------------------+ IVC Mid   patent        Limited visualization +----------+------+--------+---------------------+ IVC Distal       acute                        +----------+------+--------+---------------------+  +-------------------+---------+-----------+---------+-----------+--------+         CIV        RT-PatentRT-ThrombusLT-PatentLT-ThrombusComments +-------------------+---------+-----------+---------+-----------+--------+ Common Iliac Prox   patent                         acute            +-------------------+---------+-----------+---------+-----------+--------+ Common Iliac Mid    patent                          acute            +-------------------+---------+-----------+---------+-----------+--------+ Common Iliac Distal patent                         acute            +-------------------+---------+-----------+---------+-----------+--------+  +-------------------------+---------+-----------+---------+-----------+--------+            EIV           RT-PatentRT-ThrombusLT-PatentLT-ThrombusComments +-------------------------+---------+-----------+---------+-----------+--------+ External Iliac Vein       patent              patent  Distal                                                                    +-------------------------+---------+-----------+---------+-----------+--------+   Summary: IVC/Iliac: There is evidence of acute thrombus involving the distal IVC. There is no evidence of thrombus involving the right common iliac vein. There is evidence of acute thrombus involving the left common iliac vein. There is no evidence of thrombus involving the right external iliac vein and left external iliac vein.  *See table(s) above for measurements and observations.  Electronically sSherald Hesstopher Clark MD on 10/15/2019 at 2:47:41 PM.    Final    ECHOCARDIOGRAM COMPLETE  Result Date: 10/14/2019    ECHOCARDIOGRAM REPORT   Patient Name:   MENA LIENAU Date of Exam: 10/14/2019 Medical Rec #:  [id number]           Height:       73.0 in Accession #:    [id number]          Weight:       322.1 lb Date of Birth:  11/04/1975           BSA:          2.635 m Patient Age:    43 years            BP:           107/83 mmHg Patient Gender: M                   HR:           95 bpm. Exam Location:  Inpatient Procedure: 2D Echo Indications:    pulmonary embolus 415.19  History:        Patient has no prior history of Echocardiogram examinations.                 Risk Factors:Hypertension and Dyslipidemia.  Sonographer:    Delcie Roch Referring Phys: [id number] Deno Lunger SHALHOUB  IMPRESSIONS  1. Left ventricular ejection fraction, by estimation, is 55%%. The left ventricle has normal function. The left ventricle has no regional wall motion abnormalities. There is mild left ventricular hypertrophy. Left ventricular diastolic parameters were normal.  2. Right ventricular systolic function is severely reduced. The right ventricular size is moderately enlarged. There is normal pulmonary artery systolic pressure.  3. The mitral valve is normal in structure. Trivial mitral valve regurgitation.  4. The aortic valve is normal in structure. Aortic valve regurgitation is not visualized.  5. The inferior vena cava is dilated in size with >50% respiratory variability, suggesting right atrial pressure of 8 mmHg. FINDINGS  Left Ventricle: Left ventricular ejection fraction, by estimation, is 55%%. The left ventricle has normal function. The left ventricle has no regional wall motion abnormalities. The left ventricular internal cavity size was normal in size. There is mild  left ventricular hypertrophy. Left ventricular diastolic parameters were normal. Right Ventricle: The right ventricular size is moderately enlarged. Right vetricular wall thickness was not assessed. Right ventricular systolic function is severely reduced. There is normal pulmonary artery systolic pressure. The tricuspid regurgitant velocity is 2.21 m/s, and with an assumed right atrial pressure of 3 mmHg, the estimated right ventricular systolic pressure is 22.5 mmHg. Left Atrium: Left atrial size  was normal in size. Right Atrium: Right atrial size was normal in size. Pericardium: There is no evidence of pericardial effusion. Mitral Valve: The mitral valve is normal in structure. Trivial mitral valve regurgitation. Tricuspid Valve: The tricuspid valve is normal in structure. Tricuspid valve regurgitation is mild. Aortic Valve: The aortic valve is normal in structure. Aortic valve regurgitation is not visualized. Pulmonic Valve: The  pulmonic valve was normal in structure. Pulmonic valve regurgitation is trivial. Aorta: The aortic root is normal in size and structure. Venous: The inferior vena cava is dilated in size with greater than 50% respiratory variability, suggesting right atrial pressure of 8 mmHg. IAS/Shunts: The interatrial septum was not assessed.  LEFT VENTRICLE PLAX 2D LVIDd:         5.00 cm  Diastology LVIDs:         3.70 cm  LV e' lateral:   13.30 cm/s LV PW:         1.00 cm  LV E/e' lateral: 4.6 LV IVS:        0.90 cm  LV e' medial:    8.92 cm/s LVOT diam:     2.40 cm  LV E/e' medial:  6.8 LV SV:         62 LV SV Index:   23 LVOT Area:     4.52 cm  RIGHT VENTRICLE            IVC RV S prime:     8.70 cm/s  IVC diam: 2.10 cm TAPSE (M-mode): 1.6 cm LEFT ATRIUM             Index       RIGHT ATRIUM           Index LA diam:        3.80 cm 1.44 cm/m  RA Area:     14.40 cm LA Vol (A2C):   37.5 ml 14.23 ml/m RA Volume:   34.20 ml  12.98 ml/m LA Vol (A4C):   34.5 ml 13.09 ml/m LA Biplane Vol: 37.6 ml 14.27 ml/m  AORTIC VALVE LVOT Vmax:   84.20 cm/s LVOT Vmean:  60.000 cm/s LVOT VTI:    0.136 m  AORTA Ao Root diam: 3.50 cm Ao Asc diam:  3.20 cm MITRAL VALVE               TRICUSPID VALVE MV Area (PHT): 4.36 cm    TV Peak grad:   18.7 mmHg MV Decel Time: 174 msec    TV Vmax:        2.16 m/s MV E velocity: 60.80 cm/s  TR Peak grad:   19.5 mmHg MV A velocity: 70.70 cm/s  TR Vmax:        221.00 cm/s MV E/A ratio:  0.86                            SHUNTS                            Systemic VTI:  0.14 m                            Systemic Diam: 2.40 cm Dietrich Pates MD Electronically signed by Dietrich Pates MD Signature Date/Time: 10/14/2019/12:04:27 PM    Final         Scheduled Meds: . amLODipine  2.5 mg Oral Daily  .  famotidine  20 mg Oral BID  . ondansetron (ZOFRAN) IV  4 mg Intravenous Once  . simvastatin  20 mg Oral q1800   Continuous Infusions: . sodium chloride Stopped (10/14/19 0535)  . sodium chloride 100 mL/hr at 10/16/19  0555  . heparin 2,300 Units/hr (10/16/19 0358)     LOS: 2 days    Time spent: 25 mins.More than 50% of that time was spent in counseling and/or coordination of care.      Burnadette Pop, MD [facility] P7/20/2021, 8:54 AM

## 2019-10-16 NOTE — Progress Notes (Addendum)
ANTICOAGULATION CONSULT NOTE - Follow Up Consult  Pharmacy Consult for heparin Indication: DVT/PE  Allergies  Allergen Reactions  . Lisinopril Cough    Patient Measurements: Height: 6\' 1"  (185.4 cm) Weight: (!) 143.2 kg (315 lb 11.2 oz) IBW/kg (Calculated) : 79.9 Heparin Dosing Weight: 114.1 kg  Vital Signs: Temp: 98.4 F (36.9 C) (07/20 0857) Temp Source: Oral (07/20 0857) BP: 139/83 (07/20 1140) Pulse Rate: 0 (07/20 1145)  Labs: Recent Labs    10/13/19 1619 10/13/19 1619 10/13/19 2204 10/14/19 0013 10/14/19 0542 10/14/19 1202 10/15/19 0512 10/15/19 0512 10/15/19 1321 10/15/19 2047 10/16/19 0439  HGB 14.9   < >  --   --  14.3  --  13.2  --   --   --   --   HCT 44.6  --   --   --  42.9  --  40.5  --   --   --   --   PLT 165  --   --   --  145*  --  168  --   --   --   --   APTT  --   --   --   --  84*   < > 56*   < > 48* 53* 67*  HEPARINUNFRC  --   --   --   --  >2.20*  --  0.56  --   --   --  0.45  CREATININE 0.90  --   --   --   --   --  0.93  --   --   --   --   TROPONINIHS  --   --  524* 1,444*  --   --   --   --   --   --   --    < > = values in this interval not displayed.    Estimated Creatinine Clearance: 152.4 mL/min (by C-G formula based on SCr of 0.93 mg/dL).   Medications:  Scheduled:  . amLODipine  2.5 mg Oral Daily  . aspirin EC  81 mg Oral Daily  . famotidine  20 mg Oral BID  . ondansetron (ZOFRAN) IV  4 mg Intravenous Once  . simvastatin  20 mg Oral q1800  . sodium chloride flush  3 mL Intravenous Q12H   Infusions:  . sodium chloride Stopped (10/14/19 0535)  . sodium chloride 100 mL/hr at 10/16/19 0555  . sodium chloride    . sodium chloride      Assessment: 44 year old male with DVT in LLE, went for lower extremity venography s/p mechanical thrombectomy + stent of L common/external iliac veins 7/20. Pt had one time dose of xarelto on 7/17 @1832 .  Pharmacy okay to restart heparin infusion after procedure without bolus. Will restart  previous rate given therapeutic APTT at 0.67 on 7/20 (heparin level came back therapeutic at 0.45- correlating). CBC stable on last check. No bleeding per nursing.   Goal of Therapy:  Heparin level 0.3-0.7 units/ml aPTT 66-102 seconds Monitor platelets by anticoagulation protocol: Yes   Plan:  Continue heparin 2300 units/hr  Order 6 hr heparin level given correlation earlier Obtain daily heparin level and aPTT Monitor for s/s bleeding   , PharmD, BCCCP Clinical Pharmacist  Phone: [phone] 10/16/2019 1:54 PM  Please check AMION for all [facility] phone numbers After 10:00 PM, call Main Pharmacy [phone]

## 2019-10-17 LAB — BASIC METABOLIC PANEL
Anion gap: 10 (ref 5–15)
BUN: 10 mg/dL (ref 6–20)
CO2: 25 mmol/L (ref 22–32)
Calcium: 8.4 mg/dL — ABNORMAL LOW (ref 8.9–10.3)
Chloride: 103 mmol/L (ref 98–111)
Creatinine, Ser: 1.05 mg/dL (ref 0.61–1.24)
GFR calc Af Amer: >60 mL/min (ref >60)
GFR calc non Af Amer: >60 mL/min (ref >60)
Glucose, Bld: 96 mg/dL (ref 70–99)
Potassium: 4 mmol/L (ref 3.5–5.1)
Sodium: 138 mmol/L (ref 135–145)

## 2019-10-17 LAB — CBC
HCT: 38.4 % — ABNORMAL LOW (ref 39.0–52.0)
Hemoglobin: 12.4 g/dL — ABNORMAL LOW (ref 13.0–17.0)
MCH: 28.1 pg (ref 26.0–34.0)
MCHC: 32.3 g/dL (ref 30.0–36.0)
MCV: 86.9 fL (ref 80.0–100.0)
Platelets: 170 10*3/uL (ref 150–400)
RBC: 4.42 MIL/uL (ref 4.22–5.81)
RDW: 13.4 % (ref 11.5–15.5)
WBC: 6.4 10*3/uL (ref 4.0–10.5)
nRBC: 0 % (ref 0.0–0.2)

## 2019-10-17 LAB — HEPARIN LEVEL (UNFRACTIONATED): Heparin Unfractionated: 0.52 IU/mL (ref 0.30–0.70)

## 2019-10-17 MED ORDER — APIXABAN (ELIQUIS) VTE STARTER PACK (10MG AND 5MG)
ORAL_TABLET | ORAL | 0 refills | Status: DC
Start: 2019-10-17 — End: 2019-11-08

## 2019-10-17 MED ORDER — OXYCODONE HCL 5 MG PO TABS: 5.0000 mg | ORAL_TABLET | Freq: Three times a day (TID) | ORAL | 0 refills | Status: AC | PRN

## 2019-10-17 MED ORDER — APIXABAN 5 MG PO TABS
10.0000 mg | ORAL_TABLET | Freq: Two times a day (BID) | ORAL | Status: DC
  Administered 2019-10-17: 10 mg via ORAL
  Filled 2019-10-17: qty 2

## 2019-10-17 MED ORDER — APIXABAN 5 MG PO TABS: 5.0000 mg | ORAL_TABLET | Freq: Two times a day (BID) | ORAL | Status: DC

## 2019-10-17 MED FILL — ELIQUIS STARTER PACK 5 MG T: 5 | 30 days supply | Qty: 74 | Fill #0

## 2019-10-17 MED FILL — oxyCODONE HCL 5 MG TABS: 5 | 3 days supply | Qty: 9 | Fill #0

## 2019-10-17 NOTE — Care Management (Signed)
1152 10-17-19 Benefits check submitted for Eliquis. Case Manager will follow for cost. Graves-Bigelow, Lamar Laundry, RN,BSN Case Manager

## 2019-10-17 NOTE — Progress Notes (Signed)
  Progress Note    10/17/2019 7:53 AM 1 Day Post-Op  Subjective: No overnight issues, left leg feeling better  Vitals:   10/16/19 1937 10/17/19 0444  BP: 127/77 129/86  Pulse: 87 83  Resp: 17 16  Temp: 98.8 F (37.1 C) 98.8 F (37.1 C)  SpO2: 95% 96%    Physical Exam: Awake alert oriented Left popliteal access site clean dry intact and bolster removed Left leg below the knee with stable nonpitting edema Bilateral pedal pulses are palpable  CBC    Component Value Date/Time   WBC 6.4 10/17/2019 0627   RBC 4.42 10/17/2019 0627   HGB 12.4 (L) 10/17/2019 0627   HCT 38.4 (L) 10/17/2019 0627   PLT 170 10/17/2019 0627   MCV 86.9 10/17/2019 0627   MCH 28.1 10/17/2019 0627   MCHC 32.3 10/17/2019 0627   RDW 13.4 10/17/2019 0627   LYMPHSABS 2.7 10/15/2019 0512   MONOABS 0.6 10/15/2019 0512   EOSABS 0.3 10/15/2019 0512   BASOSABS 0.1 10/15/2019 0512    BMET    Component Value Date/Time   NA 135 10/15/2019 0512   K 3.7 10/15/2019 0512   CL 103 10/15/2019 0512   CO2 24 10/15/2019 0512   GLUCOSE 109 (H) 10/15/2019 0512   BUN 11 10/15/2019 0512   CREATININE 0.93 10/15/2019 0512   CALCIUM 8.5 (L) 10/15/2019 0512   GFRNONAA >60 10/15/2019 0512   GFRAA >60 10/15/2019 0512    INR No results found for: INR   Intake/Output Summary (Last 24 hours) at 10/17/2019 0753 Last data filed at 10/16/2019 1937 Gross per 24 hour  Intake 2028.41 ml  Output 600 ml  Net 1428.41 ml     Assessment/plan:  44 y.o. 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  I have ordered compression stockings he needs to have these fitted for his left lower extremity and will need oral anticoagulation for a period of 6 months after which he should be able to stop and transition to aspirin indefinitely.  He is okay to return to work this Friday if he so chooses.  I have discussed wearing compression when he is out of bed.  I will have him follow-up in 4 to  6 weeks with IVC iliac duplex in our office.   Jamilya Sarrazin C. Randie Heinz, MD [facility] Office: [phone] Pager: [phone]  10/17/2019 7:53 AM

## 2019-10-17 NOTE — Discharge Summary (Signed)
Discharge Summary  Christian Stevens ZOX:[id number] DOB: 1976-01-15  PCP: Georgann Housekeeper, MD  Admit date: 10/13/2019 Discharge date: 10/17/2019  Time spent: 40 mins   Recommendations for Outpatient Follow-up:  1. Follow-up with PCP in 1 week 2. Follow-up with vascular surgery scheduled    Discharge Diagnoses:  Active Hospital Problems   Diagnosis Date Noted  . Acute pulmonary embolism (HCC) 10/13/2019  . Acute deep vein thrombosis (DVT) of tibial vein of left lower extremity (HCC) 10/14/2019  . Elevated troponin level not due myocardial infarction 10/14/2019  . Mixed hyperlipidemia 10/14/2019  . Essential hypertension 10/14/2019  . GERD without esophagitis 10/14/2019    Resolved Hospital Problems  No resolved problems to display.    Discharge Condition: Stable  Diet recommendation: As tolerated  Vitals:   10/17/19 0444 10/17/19 0907  BP: 129/86 135/88  Pulse: 83   Resp: 16   Temp: 98.8 F (37.1 C)   SpO2: 96%     History of present illness:  Patient is a 44 year old male with history of hypertension, GERD, hyperlipidemia who presented with left lower extremity pain.  He developed pain 2 weeks ago before admission and also complained of significant swelling of the affected extremity.  On presentation, left lower extremity ultrasound revealed left posterior tibial DVT but subsequently developed severe shortness of breath and respiratory status in the emergency department.  Troponin was elevated.  Found to have acute PE.  Started on heparin drip.  Due to clot burden in the lower extremity, vascular surgery also consulted.  Ileal caval duplex showed thrombus to the level of IVC and occlusive in his common external iliac veins.    Today, pt denies any new complaints, still with LLE pain and swelling, otherwise denies any chest pain, worsening shortness of breath, abdominal pain, nausea/vomiting, fever/chills    Hospital Course:  Principal Problem:   Acute pulmonary  embolism (HCC) Active Problems:   Acute deep vein thrombosis (DVT) of tibial vein of left lower extremity (HCC)   Elevated troponin level not due myocardial infarction   Mixed hyperlipidemia   Essential hypertension   GERD without esophagitis   Acute PE/acute left lower extremity DVT Venous Doppler was positive for acute DVT Initial CT chest was negative for PE but repeat was positive for submassive bilateral PE Echocardiogram showed significant right heart strain with severely reduced right ventricular systolic function, moderately enlarged right ventricular size Vascular surgery consulted for clot burden. Ileal caval duplex showed thrombus to the level of IVC and occlusive in his common external iliac veins s/p thrombectomy and stenting on 10/16/19 Status post IV heparin, discharged on p.o. Eliquis for total of 6 months for now Follow-up with PCP, vascular surgery as discussed  Elevated troponin level Currently chest pain-free Secondary to supply demand ischemia secondary to PE   Echocardiogram showed ejection fraction of 55%.  Hyperlipidemia Continue statin  Hypertension Continue home regimen  GERD Continue H2 blocker  Suspicion for sleep apnea Recommend outpatient  sleep study Follow-up with PCP  Morbid obesity Lifestyle modification advised         Malnutrition Type:      Malnutrition Characteristics:      Nutrition Interventions:      Estimated body mass index is 42.03 kg/m as calculated from the following:   Height as of this encounter: 6\' 1"  (1.854 m).   Weight as of this encounter: 144.5 kg.    Procedures:  Mechanical thrombectomy and stenting  Consultations:  Vascular surgery    Discharge Exam: BP 135/88  Pulse 83   Temp 98.8 F (37.1 C) (Oral)   Resp 16   Ht 6\' 1"  (1.854 m)   Wt (!) 144.5 kg   SpO2 96%   BMI 42.03 kg/m   General: NAD Cardiovascular: S1, S2 present Respiratory: CTA B     Discharge  Instructions You were cared for by a hospitalist during your hospital stay. If you have any questions about your discharge medications or the care you received while you were in the hospital after you are discharged, you can call the unit and asked to speak with the hospitalist on call if the hospitalist that took care of you is not available. Once you are discharged, your primary care physician will handle any further medical issues. Please note that NO REFILLS for any discharge medications will be authorized once you are discharged, as it is imperative that you return to your primary care physician (or establish a relationship with a primary care physician if you do not have one) for your aftercare needs so that they can reassess your need for medications and monitor your lab values.  Discharge Instructions    Diet - low sodium heart healthy   Complete by: As directed    Increase activity slowly   Complete by: As directed      Allergies as of 10/17/2019      Reactions   Lisinopril Cough      Medication List    STOP taking these medications   diclofenac 75 MG EC tablet Commonly known as: VOLTAREN   diphenhydrAMINE 25 MG tablet Commonly known as: Benadryl   famotidine 20 MG tablet Commonly known as: Pepcid     TAKE these medications   amLODipine 2.5 MG tablet Commonly known as: NORVASC Take 2.5 mg by mouth daily.   Apixaban Starter Pack (10mg  and 5mg ) Commonly known as: ELIQUIS STARTER PACK Take as directed on package: start with two-5mg  tablets twice daily for 7 days. On day 8, switch to one-5mg  tablet twice daily.   losartan 100 MG tablet Commonly known as: COZAAR Take 100 mg by mouth daily.   omeprazole 20 MG tablet Commonly known as: PRILOSEC OTC Take 20 mg by mouth daily.   OVER THE COUNTER MEDICATION Take 1 tablet by mouth daily. Calcium supplement   oxyCODONE 5 MG immediate release tablet Commonly known as: Oxy IR/ROXICODONE Take 1 tablet (5 mg total) by mouth  every 8 (eight) hours as needed for up to 3 days for moderate pain.   simvastatin 20 MG tablet Commonly known as: ZOCOR Take 20 mg by mouth daily.      Allergies  Allergen Reactions  . Lisinopril Cough    Follow-up Information    Schedule an appointment as soon as possible for a visit  with Georgann Housekeeper, MD.   Specialty: Internal Medicine Why: Follow-up with your PCP within the next week for further evaluation. Contact information: [address] [place] [address] [phone]        Maeola Harman, MD In 6 weeks.   Specialties: Vascular Surgery, Cardiology Why: Office will call you to arrange your appt (sent) Contact information: [address] [place] [address] [phone]                The results of significant diagnostics from this hospitalization (including imaging, microbiology, ancillary and laboratory) are listed below for reference.    Significant Diagnostic Studies: CT ANGIO CHEST PE W OR WO CONTRAST  Result Date: 10/14/2019 CLINICAL DATA:  Clinical deterioration in patient with known deep venous thrombosis. Suspicion of pulmonary embolism. EXAM: CT ANGIOGRAPHY CHEST WITH CONTRAST TECHNIQUE: Multidetector CT imaging of the chest was performed using the standard protocol during bolus administration of intravenous contrast. Multiplanar CT image reconstructions and MIPs were obtained to evaluate the vascular anatomy. CONTRAST:  OMNIPAQUE IOHEXOL 350 MG/ML SOLN COMPARISON:  Suboptimal exam of 1 day prior FINDINGS: Cardiovascular: The quality of this exam for evaluation of pulmonary embolism is moderate, but sufficient. Left lower lobe lobar and segmental pulmonary emboli identified. Segmental to subsegmental right-sided emboli. The RV-LV ratio is 5.8-3.7 cm=1.6. Bovine arch.  Mild cardiomegaly. Mediastinum/Nodes: No mediastinal or hilar adenopathy. Lungs/Pleura: No pleural fluid. 2 mm right upper lobe pulmonary nodules x2 on  52/6. Upper Abdomen: Hepatic steatosis. Normal imaged portions of the spleen, stomach, pancreas, gallbladder. Musculoskeletal: No acute osseous abnormality. Review of the MIP images confirms the above findings. IMPRESSION: 1. Left larger than right, moderate volume pulmonary emboli. Positive for acute PE with CT evidence of right heart strain (RV/LV Ratio = 1.6) consistent with at least submassive (intermediate risk) PE. The presence of right heart strain has been associated with an increased risk of morbidity and mortality. Please refer to the "PE Focused" order set in EPIC. 2. Tiny right upper lobe pulmonary nodules. No follow-up needed if patient is low-risk. Non-contrast chest CT can be considered in 12 months if patient is high-risk. This recommendation follows the consensus statement: Guidelines for Management of Incidental Pulmonary Nodules Detected on CT Images: From the Fleischner Society 2017; Radiology 2017; 284:228-243. 3. Hepatic steatosis. These results will be called to the ordering clinician or representative by the Radiologist Assistant, and communication documented in the PACS or [employer]. Electronically Signed   By: Jeronimo Greaves M.D.   On: 10/14/2019 08:02   CT Angio Chest PE W and/or Wo Contrast  Result Date: 10/13/2019 CLINICAL DATA:  Left leg DVT diagnosed today. Diaphoresis and tachycardia. EXAM: CT ANGIOGRAPHY CHEST WITH CONTRAST TECHNIQUE: Multidetector CT imaging of the chest was performed using the standard protocol during bolus administration of intravenous contrast. Multiplanar CT image reconstructions and MIPs were obtained to evaluate the vascular anatomy. CONTRAST:  OMNIPAQUE IOHEXOL 350 MG/ML SOLN COMPARISON:  Chest radiographs dated 01/31/2013 FINDINGS: Cardiovascular: The pulmonary arteries are suboptimally visualized due to delayed timing of the contrast bolus and the large size of the patient. No gross pulmonary arterial filling defects are seen. Branch vessel  defects are difficult to exclude. Normal sized heart. Mediastinum/Nodes: No enlarged mediastinal, hilar, or axillary lymph nodes. Thyroid gland, trachea, and esophagus demonstrate no significant findings. Lungs/Pleura: Minimal bilateral dependent atelectasis. No pleural fluid. Upper Abdomen: Unremarkable. Musculoskeletal: Thoracic spine degenerative changes. Review of the MIP images confirms the above findings. IMPRESSION: 1. Suboptimally visualized pulmonary arteries due to delayed timing of the contrast bolus and the large size of the patient. No gross pulmonary arterial filling defects are seen. Branch vessel defects cannot be excluded. 2. Minimal bilateral dependent atelectasis. Electronically Signed   By: Beckie Salts M.D.   On: 10/13/2019 17:52   CARDIAC CATHETERIZATION  Result Date: 10/16/2019 Patient name: Darris Staiger MRN: [id number] DOB: [birth year] Sex: male 10/16/2019 Pre-operative Diagnosis: dvt Post-operative diagnosis:  Same Surgeon:  Apolinar Junes C. Randie Heinz, MD Procedure Performed: 1.  Ultrasound-guided cannulation left small saphenous vein 2.  Left lower extremity and IVC venogram 3.  Intravascular ultrasound left popliteal, left femoral, left common femoral, left external and common iliac veins and IVC 4.  Mechanical thrombectomy of left  popliteal, left femoral, left common femoral, left external and common iliac veins with Inari Clottriever 5.  Stent of left common and external iliac veins with 18 x 90 mm Wallstent 6.  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  He is indicated for venogram possible invention of the left lower extremity. Findings: Small saphenous vein did appear to have sluggish flow but was patent.  This was cannulated.  From the popliteal vein all the way through the common iliac vein patient appeared to have dilated veins which had sluggish flow there  was some evidence of webbing and also possible acute appearing thrombus.  At the confluence of the IVC the left common iliac vein measured 4 mm at its greatest diameter.  To have chronic appearing thrombus was occluded.  After mechanical thrombectomy and stenting we had no further thrombus.  We did not retrieve significant amounts of thrombus was likely mostly sluggish or rouleaux flow.  Stent greatest diameter 11 mm at the area of the May Thurner anatomy.  Procedure:  The patient was identified in the holding area and taken to Lab 8 where he was placed prone on the procedure table timeout was called.  He was sterilely prepped and draped in the left popliteal fossa.  Moderate sedation with fentanyl and Versed was administered and vital signs were monitored throughout the course.  Ultrasound was used to identify a patent small saphenous vein.  There is anesthetized 1% lidocaine cannulated micropuncture needle followed the wire sheath.  And images saved the permanent record.  We placed a Glidewire advantage followed by [place] sheath.  Glidewire advantage did stop at the level of the common iliac vein.  We used a bare catheter directed to the IVC confirmed intraluminal access with venogram.  Patient was fully heparinized at this time.  We exchanged for an IVUS catheter and performed intravascular ultrasound with the above findings.  We exchanged over the wire for the Inari Clottriever sheath.  We then performed mechanical thrombectomy from the common iliac vein all the way through the popliteal vein.  We made 3 separate passes there was only minimal clot retrieved.  We then used 12 mm balloon to angioplasty the common external leg veins.  There was a significant waist at the level of L4-L5.  We then repeated intravascular ultrasound primarily stented with an 18 x 90 mm Wallstent.  This was postdilated with 12 mm balloon.  Completion demonstrated flow from the popliteal vein all the way through the IVC.  This was  confirmed with venography.  Satisfied we removed our wires.  We suture a bolster in place at the small saphenous access site and the sheath was removed.  He tolerated procedure without any complication. Contrast: 40cc Brandon C. Randie Heinzain, MD [facility] Office: [phone] Pager: [phone]  PERIPHERAL VASCULAR CATHETERIZATION  Result Date: 10/16/2019 Patient name: Roxanna MewChristopher Alviar MRN: [id number] DOB: 14-Oct-1975 Sex: male 10/16/2019 Pre-operative Diagnosis: dvt Post-operative diagnosis:  Same Surgeon:  Apolinar JunesBrandon C. Randie Heinzain, MD Procedure Performed: 1.  Ultrasound-guided cannulation left small saphenous vein 2.  Left lower extremity and IVC venogram 3.  Intravascular ultrasound left popliteal, left femoral, left common femoral, left external and common iliac veins and IVC 4.  Mechanical thrombectomy of left popliteal, left femoral, left common femoral, left external and common iliac veins with Inari Clottriever 5.  Stent of left common and external iliac veins with 18  x 90 mm Wallstent 6.  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  He is indicated for venogram possible invention of the left lower extremity. Findings: Small saphenous vein did appear to have sluggish flow but was patent.  This was cannulated.  From the popliteal vein all the way through the common iliac vein patient appeared to have dilated veins which had sluggish flow there was some evidence of webbing and also possible acute appearing thrombus.  At the confluence of the IVC the left common iliac vein measured 4 mm at its greatest diameter.  To have chronic appearing thrombus was occluded.  After mechanical thrombectomy and stenting we had no further thrombus.  We did not retrieve significant amounts of thrombus was likely mostly sluggish or rouleaux flow.  Stent greatest diameter 11 mm at the  area of the May Thurner anatomy.  Procedure:  The patient was identified in the holding area and taken to Lab 8 where he was placed prone on the procedure table timeout was called.  He was sterilely prepped and draped in the left popliteal fossa.  Moderate sedation with fentanyl and Versed was administered and vital signs were monitored throughout the course.  Ultrasound was used to identify a patent small saphenous vein.  There is anesthetized 1% lidocaine cannulated micropuncture needle followed the wire sheath.  And images saved the permanent record.  We placed a Glidewire advantage followed by [place] sheath.  Glidewire advantage did stop at the level of the common iliac vein.  We used a bare catheter directed to the IVC confirmed intraluminal access with venogram.  Patient was fully heparinized at this time.  We exchanged for an IVUS catheter and performed intravascular ultrasound with the above findings.  We exchanged over the wire for the Inari Clottriever sheath.  We then performed mechanical thrombectomy from the common iliac vein all the way through the popliteal vein.  We made 3 separate passes there was only minimal clot retrieved.  We then used 12 mm balloon to angioplasty the common external leg veins.  There was a significant waist at the level of L4-L5.  We then repeated intravascular ultrasound primarily stented with an 18 x 90 mm Wallstent.  This was postdilated with 12 mm balloon.  Completion demonstrated flow from the popliteal vein all the way through the IVC.  This was confirmed with venography.  Satisfied we removed our wires.  We suture a bolster in place at the small saphenous access site and the sheath was removed.  He tolerated procedure without any complication. Contrast: 40cc Brandon C. Randie Heinz, MD [facility] Office: [phone] Pager: [phone]  US Venous Img Lower Unilateral Left  Result Date: 10/13/2019 CLINICAL DATA:  Left lower leg pain and  swelling for 2 weeks. EXAM: LEFT LOWER EXTREMITY VENOUS DOPPLER ULTRASOUND TECHNIQUE: Gray-scale sonography with compression, as well as color and duplex ultrasound, were performed to evaluate the deep venous system(s) from the level of the common femoral vein through the popliteal and proximal calf veins. COMPARISON:  None. FINDINGS: VENOUS Normal compressibility of the common femoral, superficial femoral, and popliteal veins. Visualized portions of profunda femoral vein and great saphenous vein unremarkable. Doppler waveforms show normal direction of venous flow, normal respiratory plasticity and response to augmentation. There is lack of compression in the mid to distal left posterior tibial vein with no flow on color Doppler. The proximal  aspect of the vein could not be visualized. Additionally there is some evidence of occlusive thrombus at the tibial peroneal trunk. The peroneal veins could not be visualized. Limited views of the contralateral common femoral vein are unremarkable. OTHER None. Limitations: none IMPRESSION: Positive for occlusive thrombus in the mid to distal posterior tibial vein in the left calf. There is also some evidence of thrombus at the tibial peroneal trunk in the calf. There is no evidence of thrombus in the common femoral through popliteal veins. These results will be called to the ordering clinician or representative by the Radiologist Assistant, and communication documented in the PACS or [employer]. Electronically Signed   By: Emmaline Kluver M.D.   On: 10/13/2019 16:09   VAS [place] IVC/ILIAC (VENOUS ONLY)  Result Date: 10/15/2019 IVC/ILIAC STUDY Limitations: Air/bowel gas and obesity.  Comparison Study: No prior study Performing Technologist: Gertie Fey MHA, RDMS, RVT, RDCS  Examination Guidelines: A complete evaluation includes B-mode imaging, spectral Doppler, color Doppler, and power Doppler as needed of all accessible portions of each vessel. Bilateral testing  is considered an integral part of a complete examination. Limited examinations for reoccurring indications may be performed as noted.  IVC/Iliac Findings: +----------+------+--------+---------------------+    IVC    PatentThrombus      Comments        +----------+------+--------+---------------------+ IVC Prox                Unable to visualize   +----------+------+--------+---------------------+ IVC Mid   patent        Limited visualization +----------+------+--------+---------------------+ IVC Distal       acute                        +----------+------+--------+---------------------+  +-------------------+---------+-----------+---------+-----------+--------+         CIV        RT-PatentRT-ThrombusLT-PatentLT-ThrombusComments +-------------------+---------+-----------+---------+-----------+--------+ Common Iliac Prox   patent                         acute            +-------------------+---------+-----------+---------+-----------+--------+ Common Iliac Mid    patent                         acute            +-------------------+---------+-----------+---------+-----------+--------+ Common Iliac Distal patent                         acute            +-------------------+---------+-----------+---------+-----------+--------+  +-------------------------+---------+-----------+---------+-----------+--------+            EIV           RT-PatentRT-ThrombusLT-PatentLT-ThrombusComments +-------------------------+---------+-----------+---------+-----------+--------+ External Iliac Vein       patent              patent                      Distal                                                                    +-------------------------+---------+-----------+---------+-----------+--------+   Summary: IVC/Iliac: There is evidence of acute thrombus involving  the distal IVC. There is no evidence of thrombus involving the right common iliac vein. There is  evidence of acute thrombus involving the left common iliac vein. There is no evidence of thrombus involving the right external iliac vein and left external iliac vein.  *See table(s) above for measurements and observations.  Electronically signed by Sherald Hess MD on 10/15/2019 at 2:47:41 PM.    Final    ECHOCARDIOGRAM COMPLETE  Result Date: 10/14/2019    ECHOCARDIOGRAM REPORT   Patient Name:   NICHAEL EHLY Date of Exam: 10/14/2019 Medical Rec #:  [id number]           Height:       73.0 in Accession #:    [id number]          Weight:       322.1 lb Date of Birth:  07-23-1975           BSA:          2.635 m Patient Age:    43 years            BP:           107/83 mmHg Patient Gender: M                   HR:           95 bpm. Exam Location:  Inpatient Procedure: 2D Echo Indications:    pulmonary embolus 415.19  History:        Patient has no prior history of Echocardiogram examinations.                 Risk Factors:Hypertension and Dyslipidemia.  Sonographer:    Delcie Roch Referring Phys: [id number] Deno Lunger [place] IMPRESSIONS  1. Left ventricular ejection fraction, by estimation, is 55%%. The left ventricle has normal function. The left ventricle has no regional wall motion abnormalities. There is mild left ventricular hypertrophy. Left ventricular diastolic parameters were normal.  2. Right ventricular systolic function is severely reduced. The right ventricular size is moderately enlarged. There is normal pulmonary artery systolic pressure.  3. The mitral valve is normal in structure. Trivial mitral valve regurgitation.  4. The aortic valve is normal in structure. Aortic valve regurgitation is not visualized.  5. The inferior vena cava is dilated in size with >50% respiratory variability, suggesting right atrial pressure of 8 mmHg. FINDINGS  Left Ventricle: Left ventricular ejection fraction, by estimation, is 55%%. The left ventricle has normal function. The left ventricle has no regional wall  motion abnormalities. The left ventricular internal cavity size was normal in size. There is mild  left ventricular hypertrophy. Left ventricular diastolic parameters were normal. Right Ventricle: The right ventricular size is moderately enlarged. Right vetricular wall thickness was not assessed. Right ventricular systolic function is severely reduced. There is normal pulmonary artery systolic pressure. The tricuspid regurgitant velocity is 2.21 m/s, and with an assumed right atrial pressure of 3 mmHg, the estimated right ventricular systolic pressure is 22.5 mmHg. Left Atrium: Left atrial size was normal in size. Right Atrium: Right atrial size was normal in size. Pericardium: There is no evidence of pericardial effusion. Mitral Valve: The mitral valve is normal in structure. Trivial mitral valve regurgitation. Tricuspid Valve: The tricuspid valve is normal in structure. Tricuspid valve regurgitation is mild. Aortic Valve: The aortic valve is normal in structure. Aortic valve regurgitation is not visualized. Pulmonic Valve: The pulmonic valve was normal in structure. Pulmonic valve regurgitation is trivial. Aorta: The  aortic root is normal in size and structure. Venous: The inferior vena cava is dilated in size with greater than 50% respiratory variability, suggesting right atrial pressure of 8 mmHg. IAS/Shunts: The interatrial septum was not assessed.  LEFT VENTRICLE PLAX 2D LVIDd:         5.00 cm  Diastology LVIDs:         3.70 cm  LV e' lateral:   13.30 cm/s LV PW:         1.00 cm  LV E/e' lateral: 4.6 LV IVS:        0.90 cm  LV e' medial:    8.92 cm/s LVOT diam:     2.40 cm  LV E/e' medial:  6.8 LV SV:         62 LV SV Index:   23 LVOT Area:     4.52 cm  RIGHT VENTRICLE            IVC RV S prime:     8.70 cm/s  IVC diam: 2.10 cm TAPSE (M-mode): 1.6 cm LEFT ATRIUM             Index       RIGHT ATRIUM           Index LA diam:        3.80 cm 1.44 cm/m  RA Area:     14.40 cm LA Vol (A2C):   37.5 ml 14.23 ml/m  RA Volume:   34.20 ml  12.98 ml/m LA Vol (A4C):   34.5 ml 13.09 ml/m LA Biplane Vol: 37.6 ml 14.27 ml/m  AORTIC VALVE LVOT Vmax:   84.20 cm/s LVOT Vmean:  60.000 cm/s LVOT VTI:    0.136 m  AORTA Ao Root diam: 3.50 cm Ao Asc diam:  3.20 cm MITRAL VALVE               TRICUSPID VALVE MV Area (PHT): 4.36 cm    TV Peak grad:   18.7 mmHg MV Decel Time: 174 msec    TV Vmax:        2.16 m/s MV E velocity: 60.80 cm/s  TR Peak grad:   19.5 mmHg MV A velocity: 70.70 cm/s  TR Vmax:        221.00 cm/s MV E/A ratio:  0.86                            SHUNTS                            Systemic VTI:  0.14 m                            Systemic Diam: 2.40 cm Dietrich Pates MD Electronically signed by Dietrich Pates MD Signature Date/Time: 10/14/2019/12:04:27 PM    Final     Microbiology: Recent Results (from the past 240 hour(s))  SARS Coronavirus 2 by RT PCR (hospital order, performed in [facility] hospital lab) Nasopharyngeal Nasopharyngeal Swab     Status: None   Collection Time: 10/13/19  6:27 PM   Specimen: Nasopharyngeal Swab  Result Value Ref Range Status   SARS Coronavirus 2 NEGATIVE NEGATIVE Final    Comment: (NOTE) SARS-CoV-2 target nucleic acids are NOT DETECTED.  The SARS-CoV-2 RNA is generally detectable in upper and lower respiratory specimens during the acute phase of infection. The lowest concentration  of SARS-CoV-2 viral copies this assay can detect is 250 copies / mL. A negative result does not preclude SARS-CoV-2 infection and should not be used as the sole basis for treatment or other patient management decisions.  A negative result may occur with improper specimen collection / handling, submission of specimen other than nasopharyngeal swab, presence of viral mutation(s) within the areas targeted by this assay, and inadequate number of viral copies (<250 copies / mL). A negative result must be combined with clinical observations, patient history, and epidemiological information.  Fact Sheet for  Patients:   BoilerBrush.com.cy  Fact Sheet for Healthcare Providers: https://pope.com/  This test is not yet approved or  cleared by the [place] FDA and has been authorized for detection and/or diagnosis of SARS-CoV-2 by FDA under an Emergency Use Authorization (EUA).  This EUA will remain in effect (meaning this test can be used) for the duration of the COVID-19 declaration under Section 564(b)(1) of the Act, 21 U.S.C. section 360bbb-3(b)(1), unless the authorization is terminated or revoked sooner.  Performed at [facility], [address] [place], [place] [address]      Labs: Basic Metabolic Panel: Recent Labs  Lab 10/13/19 1619 10/15/19 0512 10/17/19 0627  NA 136 135 138  K 3.7 3.7 4.0  CL 103 103 103  CO2 GLUCOSE 119* 109* 96  BUN CREATININE 0.90 0.93 1.05  CALCIUM 8.8* 8.5* 8.4*  MG  --  1.9  --    Liver Function Tests: Recent Labs  Lab 10/15/19 0512  AST 64*  ALT 84*  ALKPHOS 66  BILITOT 1.1  PROT 6.9  ALBUMIN 2.9*   No results for input(s): LIPASE, AMYLASE in the last 168 hours. No results for input(s): AMMONIA in the last 168 hours. CBC: Recent Labs  Lab 10/13/19 1619 10/14/19 0542 10/15/19 0512 10/17/19 0627  WBC 11.3* 11.4* 6.5 6.4  NEUTROABS 5.1 7.1 2.8  --   HGB 14.9 14.3 13.2 12.4*  HCT 44.6 42.9 40.5 38.4*  MCV 84.3 85.3 86.7 86.9  PLT 165 145* 168 170   Cardiac Enzymes: No results for input(s): CKTOTAL, CKMB, CKMBINDEX, TROPONINI in the last 168 hours. BNP: BNP (last 3 results) Recent Labs    10/13/19 2204  BNP 16.2    ProBNP (last 3 results) No results for input(s): PROBNP in the last 8760 hours.  CBG: No results for input(s): GLUCAP in the last 168 hours.     Signed:  Briant Cedar, MD [facility] 10/17/2019, 1:21 PM

## 2019-10-17 NOTE — Progress Notes (Signed)
ANTICOAGULATION CONSULT NOTE  Pharmacy Consult for heparin>apixaban Indication: DVT/PE  Allergies  Allergen Reactions  . Lisinopril Cough    Patient Measurements: Height: 6\' 1"  (185.4 cm) Weight: (!) 144.5 kg (318 lb 9 oz) IBW/kg (Calculated) : 79.9 Heparin Dosing Weight: 114.1 kg  Vital Signs: Temp: 98.8 F (37.1 C) (07/21 0444) Temp Source: Oral (07/21 0444) BP: 129/86 (07/21 0444) Pulse Rate: 83 (07/21 0444)  Labs: Recent Labs    10/15/19 0512 10/15/19 0512 10/15/19 1321 10/15/19 2047 10/16/19 0439 10/16/19 1213 10/16/19 1949 10/17/19 0627  HGB 13.2  --   --   --   --   --   --  12.4*  HCT 40.5  --   --   --   --   --   --  38.4*  PLT 168  --   --   --   --   --   --  170  APTT 56*  --    < > 53* 67* 176*  --   --   HEPARINUNFRC 0.56   < >  --   --  0.45  --  0.42 0.52  CREATININE 0.93  --   --   --   --   --   --   --    < > = values in this interval not displayed.    Estimated Creatinine Clearance: 153.1 mL/min (by C-G formula based on SCr of 0.93 mg/dL).   Medications:  Scheduled:  . amLODipine  2.5 mg Oral Daily  . aspirin EC  81 mg Oral Daily  . famotidine  20 mg Oral BID  . ondansetron (ZOFRAN) IV  4 mg Intravenous Once  . simvastatin  20 mg Oral q1800  . sodium chloride flush  3 mL Intravenous Q12H   Infusions:  . sodium chloride Stopped (10/14/19 0535)  . sodium chloride 100 mL/hr at 10/16/19 0555  . sodium chloride    . heparin 2,300 Units/hr (10/17/19 0432)    Assessment: 44 year old male with DVT in LLE. Developed bilateral PE confirmed by CT on 7/18. Lower extremity venography s/p mechanical thrombectomy + stent of L common/external iliac veins 7/20.   IV Heparin to apixaban today for DVT/PE. H/H slightly lower than prior, PLT stable.  Goal of Therapy:  Monitor platelets by anticoagulation protocol: Yes   Plan:  Discontinue IV heparin now. Start apixaban 10 mg twice daily for 7 days then 5 mg twice daily. Monitor for s/s  bleeding.  8/20, PharmD PGY-1 Pharmacy Resident 10/17/2019 8:01 AM  Please check [email or web address] for unit-specific pharmacy phone numbers.

## 2019-10-17 NOTE — TOC Benefit Eligibility Note (Signed)
Transition of Care [facility]) Benefit Eligibility Note    Patient Details  Name: Christian Stevens MRN: [id number] Date of Birth: 13-Oct-1975   Medication/Dose: Arne Cleveland  5 MG BID   and ELIQUIS  2.5 MG BID     Tier:  (NO TIER)  Prescription Coverage Preferred Pharmacy: [facility]  and    Roseanne Kaufman with Person/Company/Phone Number:: [facility]  @  [employer] #  [phone]  Co-Pay: $14.00  Prior Approval: Yes [phone]   FOR  EACH PRESCRIPTION)  Deductible: Met (OUT-OF-POCKET:UNMET)  Additional Notes: ELIQUIS  10 MG  : NON-FORMULARY    Memory Argue Phone Number: 10/17/2019, 12:22 PM

## 2019-10-26 ENCOUNTER — Telehealth: Payer: Self-pay | Admitting: Hematology and Oncology

## 2019-10-26 NOTE — Telephone Encounter (Signed)
Received a new hem referral from Dr. Donette Larry at [facility] for DVT. Christian Stevens has been cld and scheduled to see Dr. Leonides Schanz on 8/12 at 1pm. Pt aware to arrive 15 minutes early.

## 2019-11-07 ENCOUNTER — Other Ambulatory Visit: Payer: Self-pay

## 2019-11-07 DIAGNOSIS — I82442 Acute embolism and thrombosis of left tibial vein: Secondary | ICD-10-CM

## 2019-11-07 NOTE — Progress Notes (Signed)
[facility] [phone]   [phone]  INITIAL CONSULT NOTE  Patient Care Team: Georgann Housekeeper, MD as PCP - General (Internal Medicine)  Hematological/Oncological History # Provoked Pulmonary Embolism/Lower Extremity DVT # May Thurner Syndrome s/p Stent of left common and external iliac veins 1) 10/13/2019: LLE US showed occlusive thrombus in the mid to distal posterior tibial vein in the left calf.  2) 10/14/2019: CT PE study showed left larger than right, moderate volume pulmonary emboli. Positive for acute PE with CT evidence of right heart strain 3) 11/08/2019: establish care with Dr. Leonides Schanz   CHIEF COMPLAINTS/PURPOSE OF CONSULTATION:  " Pulmonary Embolism/Lower Extremity DVT "  HISTORY OF PRESENTING ILLNESS:  Christian Stevens 44 y.o. male with medical history significant for HTN and HLD who presents for evaluation of a newly diagnosed DVT/PE.   On review of the previous records Christian Stevens initially presented the emergency department on 10/13/2019 with left lower extremity pain.  He had a left lower extremity ultrasound showed occlusive thrombus in the mid to distal posterior tibial vein in the left calf.  While emergency department he developed chest pain and marked shortness of breath.  A CT PE study was performed on 10/14/2019 which showed a left larger than right moderate volume pulmonary emboli.  The patient was positive for acute PE with CT evidence of right heart strain.  During the patient's hospitalization vascular surgery was consulted and performed a mechanical thrombectomy as well as stent placement in the left iliac veins.  Findings by vascular surgery were consistent with a May Thurner syndrome.  The patient was discharged on Eliquis therapy with plans to continue for 6 months.  The patient's primary care provider Dr. Donette Larry for the patient to hematology for further evaluation and management.  On exam today Christian Stevens notes that he is  markedly improved from his initial time of diagnosis with DVT.  He reports that he began having symptoms approximately 1 month before he was diagnosed with a clot with marked fatigue.  He reports that he was so tired that he felt as though he was not sleeping at all.  He began developing pain in his left calf the Wednesday prior to going to the emergency department, and on the Friday before he had a doubling in the size of his leg with a purple tent and tight skin.  In the emergency department he reported that is when he began having symptoms of suddenly not being able to breathe and that is what prompted the diagnosis of the pulmonary embolism.  In the interim Christian Stevens has been on Eliquis therapy without any difficulty.  He reports that he has not had any bleeding, bruising, or nosebleeds.  He reports that the medication is unfortunately too expensive after this month and therefore he will need to transition to Xarelto therapy.  He reports no personal history of prior VTE and notes there is no family history of VTE in his family.  He is a never smoker and does not drink any alcohol.  He currently works at the [employer] on [employer].  On review of systems he denies having any fevers, chills, sweats, nausea, vomiting or diarrhea.  A full 10 point ROS is listed below.  MEDICAL HISTORY:  Past Medical History:  Diagnosis Date  . Essential hypertension 10/14/2019  . GERD without esophagitis 10/14/2019  . High cholesterol   . Hypertension   . Mixed hyperlipidemia 10/14/2019    SURGICAL HISTORY: Past Surgical History:  Procedure Laterality Date  . INTRAVASCULAR ULTRASOUND/IVUS Left 10/16/2019   Procedure: Intravascular Ultrasound/IVUS;  Surgeon: Maeola Harman, MD;  Location: [facility] INVASIVE CV LAB;  Service: Cardiovascular;  Laterality: Left;  lower leg IVC  . IVC VENOGRAPHY N/A 10/16/2019   Procedure: IVC Venography;  Surgeon: Maeola Harman, MD;  Location: [facility] INVASIVE CV LAB;   Service: Cardiovascular;  Laterality: N/A;  . PERIPHERAL VASCULAR ATHERECTOMY Left 10/16/2019   Procedure: PERIPHERAL VASCULAR ATHERECTOMY;  Surgeon: Maeola Harman, MD;  Location: [facility] INVASIVE CV LAB;  Service: Cardiovascular;  Laterality: Left;  common/ext iliac /common fem popliteal  . PERIPHERAL VASCULAR INTERVENTION Left 10/16/2019   Procedure: PERIPHERAL VASCULAR INTERVENTION;  Surgeon: Maeola Harman, MD;  Location: [facility] INVASIVE CV LAB;  Service: Cardiovascular;  Laterality: Left;  COMMON/EXT ILIAC    SOCIAL HISTORY: Social History   Socioeconomic History  . Marital status: Single    Spouse name: Not on file  . Number of children: Not on file  . Years of education: Not on file  . Highest education level: Not on file  Occupational History  . Not on file  Tobacco Use  . Smoking status: Never Smoker  . Smokeless tobacco: Never Used  Vaping Use  . Vaping Use: Never used  Substance and Sexual Activity  . Alcohol use: No  . Drug use: No  . Sexual activity: Not on file  Other Topics Concern  . Not on file  Social History Narrative  . Not on file   Social Determinants of Health   Financial Resource Strain:   . Difficulty of Paying Living Expenses:   Food Insecurity:   . Worried About Programme researcher, broadcasting/film/video in the Last Year:   . Barista in the Last Year:   Transportation Needs:   . Freight forwarder (Medical):   Marland Kitchen Lack of Transportation (Non-Medical):   Physical Activity:   . Days of Exercise per Week:   . Minutes of Exercise per Session:   Stress:   . Feeling of Stress :   Social Connections:   . Frequency of Communication with Friends and Family:   . Frequency of Social Gatherings with Friends and Family:   . Attends Religious Services:   . Active Member of Clubs or Organizations:   . Attends Banker Meetings:   Marland Kitchen Marital Status:   Intimate Partner Violence:   . Fear of Current or Ex-Partner:   . Emotionally Abused:   Marland Kitchen  Physically Abused:   . Sexually Abused:     FAMILY HISTORY: Family History  Problem Relation Age of Onset  . Hypertension Father   . Hyperlipidemia Father     ALLERGIES:  is allergic to lisinopril.  MEDICATIONS:  Current Outpatient Medications  Medication Sig Dispense Refill  . amLODipine (NORVASC) 5 MG tablet Take 5 mg by mouth daily.    Marland Kitchen losartan (COZAAR) 100 MG tablet Take 100 mg by mouth daily.    Marland Kitchen omeprazole (PRILOSEC OTC) 20 MG tablet Take 20 mg by mouth daily.    Marland Kitchen OVER THE COUNTER MEDICATION Take 1 tablet by mouth daily. Calcium supplement    . rivaroxaban (XARELTO) 20 MG TABS tablet Take 1 tablet (20 mg total) by mouth daily with supper. Begin taking after completing the starter set 15mg  twice daily. 30 tablet 3  . RIVAROXABAN (XARELTO) VTE STARTER PACK (15 & 20 MG TABLETS) Follow package directions: Take one 15mg  tablet by mouth twice a day. On day 22, switch  to one 20mg  tablet once a day. Take with food. 51 each 0  . rosuvastatin (CRESTOR) 20 MG tablet Take 20 mg by mouth at bedtime.     No current facility-administered medications for this visit.    REVIEW OF SYSTEMS:   Constitutional: ( - ) fevers, ( - )  chills , ( - ) night sweats Eyes: ( - ) blurriness of vision, ( - ) double vision, ( - ) watery eyes Ears, nose, mouth, throat, and face: ( - ) mucositis, ( - ) sore throat Respiratory: ( - ) cough, ( - ) dyspnea, ( - ) wheezes Cardiovascular: ( - ) palpitation, ( - ) chest discomfort, ( - ) lower extremity swelling Gastrointestinal:  ( - ) nausea, ( - ) heartburn, ( - ) change in bowel habits Skin: ( - ) abnormal skin rashes Lymphatics: ( - ) new lymphadenopathy, ( - ) easy bruising Neurological: ( - ) numbness, ( - ) tingling, ( - ) new weaknesses Behavioral/Psych: ( - ) mood change, ( - ) new changes  All other systems were reviewed with the patient and are negative.  PHYSICAL EXAMINATION: ECOG PERFORMANCE STATUS: 0 - Asymptomatic  Vitals:   11/08/19  1259  BP: (!) 133/91  Pulse: (!) 109  Resp: 18  Temp: 98.6 F (37 C)  SpO2: 99%   Filed Weights   11/08/19 1259  Weight: (!) 312 lb 4.8 oz (141.7 kg)    GENERAL: well appearing middle aged Caucasian male in NAD  SKIN: skin color, texture, turgor are normal, no rashes or significant lesions EYES: conjunctiva are pink and non-injected, sclera clear LUNGS: clear to auscultation and percussion with normal breathing effort HEART: regular rate & rhythm and no murmurs and no lower extremity edema Musculoskeletal: no cyanosis of digits and no clubbing  PSYCH: alert & oriented x 3, fluent speech NEURO: no focal motor/sensory deficits  LABORATORY DATA:  I have reviewed the data as listed CBC Latest Ref Rng & Units 11/08/2019 10/17/2019 10/15/2019  WBC 4.0 - 10.5 K/uL 5.1 6.4 6.5  Hemoglobin 13.0 - 17.0 g/dL 16.1 12.4(L) 13.2  Hematocrit 39 - 52 % 41.9 38.4(L) 40.5  Platelets 150 - 400 K/uL 195 170 168    CMP Latest Ref Rng & Units 11/08/2019 10/17/2019 10/15/2019  Glucose 70 - 99 mg/dL 99 96 096(E)  BUN 6 - 20 mg/dL 12 10 11   Creatinine 0.61 - 1.24 mg/dL 4.54 0.98 1.19  Sodium 135 - 145 mmol/L 135 138 135  Potassium 3.5 - 5.1 mmol/L 3.9 4.0 3.7  Chloride 98 - 111 mmol/L 106 103 103  CO2 22 - 32 mmol/L 19(L) 25 24  Calcium 8.9 - 10.3 mg/dL 9.4 1.4(N) 8.2(N)  Total Protein 6.5 - 8.1 g/dL 7.9 - 6.9  Total Bilirubin 0.3 - 1.2 mg/dL 0.8 - 1.1  Alkaline Phos 38 - 126 U/L 65 - 66  AST 15 - 41 U/L 25 - 64(H)  ALT 0 - 44 U/L 33 - 84(H)    RADIOGRAPHIC STUDIES:  CT ANGIO CHEST PE W OR WO CONTRAST  Result Date: 10/14/2019 CLINICAL DATA:  Clinical deterioration in patient with known deep venous thrombosis. Suspicion of pulmonary embolism. EXAM: CT ANGIOGRAPHY CHEST WITH CONTRAST TECHNIQUE: Multidetector CT imaging of the chest was performed using the standard protocol during bolus administration of intravenous contrast. Multiplanar CT image reconstructions and MIPs were obtained to evaluate  the vascular anatomy. CONTRAST:  OMNIPAQUE IOHEXOL 350 MG/ML SOLN COMPARISON:  Suboptimal exam of  1 day prior FINDINGS: Cardiovascular: The quality of this exam for evaluation of pulmonary embolism is moderate, but sufficient. Left lower lobe lobar and segmental pulmonary emboli identified. Segmental to subsegmental right-sided emboli. The RV-LV ratio is 5.8-3.7 cm=1.6. Bovine arch.  Mild cardiomegaly. Mediastinum/Nodes: No mediastinal or hilar adenopathy. Lungs/Pleura: No pleural fluid. 2 mm right upper lobe pulmonary nodules x2 on 52/6. Upper Abdomen: Hepatic steatosis. Normal imaged portions of the spleen, stomach, pancreas, gallbladder. Musculoskeletal: No acute osseous abnormality. Review of the MIP images confirms the above findings. IMPRESSION: 1. Left larger than right, moderate volume pulmonary emboli. Positive for acute PE with CT evidence of right heart strain (RV/LV Ratio = 1.6) consistent with at least submassive (intermediate risk) PE. The presence of right heart strain has been associated with an increased risk of morbidity and mortality. Please refer to the "PE Focused" order set in EPIC. 2. Tiny right upper lobe pulmonary nodules. No follow-up needed if patient is low-risk. Non-contrast chest CT can be considered in 12 months if patient is high-risk. This recommendation follows the consensus statement: Guidelines for Management of Incidental Pulmonary Nodules Detected on CT Images: From the Fleischner Society 2017; Radiology 2017; 284:228-243. 3. Hepatic steatosis. These results will be called to the ordering clinician or representative by the Radiologist Assistant, and communication documented in the PACS or [employer]. Electronically Signed   By: Jeronimo Greaves M.D.   On: 10/14/2019 08:02   CT Angio Chest PE W and/or Wo Contrast  Result Date: 10/13/2019 CLINICAL DATA:  Left leg DVT diagnosed today. Diaphoresis and tachycardia. EXAM: CT ANGIOGRAPHY CHEST WITH CONTRAST TECHNIQUE:  Multidetector CT imaging of the chest was performed using the standard protocol during bolus administration of intravenous contrast. Multiplanar CT image reconstructions and MIPs were obtained to evaluate the vascular anatomy. CONTRAST:  OMNIPAQUE IOHEXOL 350 MG/ML SOLN COMPARISON:  Chest radiographs dated 01/31/2013 FINDINGS: Cardiovascular: The pulmonary arteries are suboptimally visualized due to delayed timing of the contrast bolus and the large size of the patient. No gross pulmonary arterial filling defects are seen. Branch vessel defects are difficult to exclude. Normal sized heart. Mediastinum/Nodes: No enlarged mediastinal, hilar, or axillary lymph nodes. Thyroid gland, trachea, and esophagus demonstrate no significant findings. Lungs/Pleura: Minimal bilateral dependent atelectasis. No pleural fluid. Upper Abdomen: Unremarkable. Musculoskeletal: Thoracic spine degenerative changes. Review of the MIP images confirms the above findings. IMPRESSION: 1. Suboptimally visualized pulmonary arteries due to delayed timing of the contrast bolus and the large size of the patient. No gross pulmonary arterial filling defects are seen. Branch vessel defects cannot be excluded. 2. Minimal bilateral dependent atelectasis. Electronically Signed   By: Beckie Salts M.D.   On: 10/13/2019 17:52   CARDIAC CATHETERIZATION  Result Date: 10/16/2019 Patient name: Christian Stevens MRN: [id number] DOB: 12/10/1975 Sex: male 10/16/2019 Pre-operative Diagnosis: dvt Post-operative diagnosis:  Same Surgeon:  Apolinar Junes C. Randie Heinz, MD Procedure Performed: 1.  Ultrasound-guided cannulation left small saphenous vein 2.  Left lower extremity and IVC venogram 3.  Intravascular ultrasound left popliteal, left femoral, left common femoral, left external and common iliac veins and IVC 4.  Mechanical thrombectomy of left popliteal, left femoral, left common femoral, left external and common iliac veins with Inari Clottriever 5.  Stent of left  common and external iliac veins with 18 x 90 mm Wallstent 6.  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  He is indicated for venogram  possible invention of the left lower extremity. Findings: Small saphenous vein did appear to have sluggish flow but was patent.  This was cannulated.  From the popliteal vein all the way through the common iliac vein patient appeared to have dilated veins which had sluggish flow there was some evidence of webbing and also possible acute appearing thrombus.  At the confluence of the IVC the left common iliac vein measured 4 mm at its greatest diameter.  To have chronic appearing thrombus was occluded.  After mechanical thrombectomy and stenting we had no further thrombus.  We did not retrieve significant amounts of thrombus was likely mostly sluggish or rouleaux flow.  Stent greatest diameter 11 mm at the area of the May Thurner anatomy.  Procedure:  The patient was identified in the holding area and taken to Lab 8 where he was placed prone on the procedure table timeout was called.  He was sterilely prepped and draped in the left popliteal fossa.  Moderate sedation with fentanyl and Versed was administered and vital signs were monitored throughout the course.  Ultrasound was used to identify a patent small saphenous vein.  There is anesthetized 1% lidocaine cannulated micropuncture needle followed the wire sheath.  And images saved the permanent record.  We placed a Glidewire advantage followed by [place] sheath.  Glidewire advantage did stop at the level of the common iliac vein.  We used a bare catheter directed to the IVC confirmed intraluminal access with venogram.  Patient was fully heparinized at this time.  We exchanged for an IVUS catheter and performed intravascular ultrasound with the above findings.  We exchanged over the wire for the Inari  Clottriever sheath.  We then performed mechanical thrombectomy from the common iliac vein all the way through the popliteal vein.  We made 3 separate passes there was only minimal clot retrieved.  We then used 12 mm balloon to angioplasty the common external leg veins.  There was a significant waist at the level of L4-L5.  We then repeated intravascular ultrasound primarily stented with an 18 x 90 mm Wallstent.  This was postdilated with 12 mm balloon.  Completion demonstrated flow from the popliteal vein all the way through the IVC.  This was confirmed with venography.  Satisfied we removed our wires.  We suture a bolster in place at the small saphenous access site and the sheath was removed.  He tolerated procedure without any complication. Contrast: 40cc Brandon C. Randie Heinz, MD [facility] Office: [phone] Pager: [phone]  PERIPHERAL VASCULAR CATHETERIZATION  Result Date: 10/16/2019 Patient name: Christian Stevens MRN: [id number] DOB: 1975-05-11 Sex: male 10/16/2019 Pre-operative Diagnosis: dvt Post-operative diagnosis:  Same Surgeon:  Apolinar Junes C. Randie Heinz, MD Procedure Performed: 1.  Ultrasound-guided cannulation left small saphenous vein 2.  Left lower extremity and IVC venogram 3.  Intravascular ultrasound left popliteal, left femoral, left common femoral, left external and common iliac veins and IVC 4.  Mechanical thrombectomy of left popliteal, left femoral, left common femoral, left external and common iliac veins with Inari Clottriever 5.  Stent of left common and external iliac veins with 18 x 90 mm Wallstent 6.  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  He is indicated for venogram possible invention of the left lower extremity. Findings: Small saphenous vein did appear to have sluggish flow but was patent.  This was cannulated.  From the  popliteal vein all the way through the common iliac vein patient appeared to have dilated veins which had sluggish flow there was some evidence of webbing and also possible acute appearing thrombus.  At the confluence of the IVC the left common iliac vein measured 4 mm at its greatest diameter.  To have chronic appearing thrombus was occluded.  After mechanical thrombectomy and stenting we had no further thrombus.  We did not retrieve significant amounts of thrombus was likely mostly sluggish or rouleaux flow.  Stent greatest diameter 11 mm at the area of the May Thurner anatomy.  Procedure:  The patient was identified in the holding area and taken to Lab 8 where he was placed prone on the procedure table timeout was called.  He was sterilely prepped and draped in the left popliteal fossa.  Moderate sedation with fentanyl and Versed was administered and vital signs were monitored throughout the course.  Ultrasound was used to identify a patent small saphenous vein.  There is anesthetized 1% lidocaine cannulated micropuncture needle followed the wire sheath.  And images saved the permanent record.  We placed a Glidewire advantage followed by [place] sheath.  Glidewire advantage did stop at the level of the common iliac vein.  We used a bare catheter directed to the IVC confirmed intraluminal access with venogram.  Patient was fully heparinized at this time.  We exchanged for an IVUS catheter and performed intravascular ultrasound with the above findings.  We exchanged over the wire for the Inari Clottriever sheath.  We then performed mechanical thrombectomy from the common iliac vein all the way through the popliteal vein.  We made 3 separate passes there was only minimal clot retrieved.  We then used 12 mm balloon to angioplasty the common external leg veins.  There was a significant waist at the level of L4-L5.  We then repeated intravascular ultrasound primarily stented with an 18 x 90 mm Wallstent.  This was  postdilated with 12 mm balloon.  Completion demonstrated flow from the popliteal vein all the way through the IVC.  This was confirmed with venography.  Satisfied we removed our wires.  We suture a bolster in place at the small saphenous access site and the sheath was removed.  He tolerated procedure without any complication. Contrast: 40cc Brandon C. Randie Heinzain, MD [facility] [place] Office: [phone] Pager: [phone]  US Venous Img Lower Unilateral Left  Result Date: 10/13/2019 CLINICAL DATA:  Left lower leg pain and swelling for 2 weeks. EXAM: LEFT LOWER EXTREMITY VENOUS DOPPLER ULTRASOUND TECHNIQUE: Gray-scale sonography with compression, as well as color and duplex ultrasound, were performed to evaluate the deep venous system(s) from the level of the common femoral vein through the popliteal and proximal calf veins. COMPARISON:  None. FINDINGS: VENOUS Normal compressibility of the common femoral, superficial femoral, and popliteal veins. Visualized portions of profunda femoral vein and great saphenous vein unremarkable. Doppler waveforms show normal direction of venous flow, normal respiratory plasticity and response to augmentation. There is lack of compression in the mid to distal left posterior tibial vein with no flow on color Doppler. The proximal aspect of the vein could not be visualized. Additionally there is some evidence of occlusive thrombus at the tibial peroneal trunk. The peroneal veins could not be visualized. Limited views of the contralateral common femoral vein are unremarkable. OTHER None. Limitations: none IMPRESSION: Positive for occlusive thrombus in the mid to distal posterior tibial vein in the left calf. There is also some evidence of  thrombus at the tibial peroneal trunk in the calf. There is no evidence of thrombus in the common femoral through popliteal veins. These results will be called to the ordering clinician or representative by the Radiologist  Assistant, and communication documented in the PACS or [employer]. Electronically Signed   By: Emmaline Kluver M.D.   On: 10/13/2019 16:09   VAS [place] IVC/ILIAC (VENOUS ONLY)  Result Date: 10/15/2019 IVC/ILIAC STUDY Limitations: Air/bowel gas and obesity.  Comparison Study: No prior study Performing Technologist: Gertie Fey MHA, RDMS, RVT, RDCS  Examination Guidelines: A complete evaluation includes B-mode imaging, spectral Doppler, color Doppler, and power Doppler as needed of all accessible portions of each vessel. Bilateral testing is considered an integral part of a complete examination. Limited examinations for reoccurring indications may be performed as noted.  IVC/Iliac Findings: +----------+------+--------+---------------------+    IVC    PatentThrombus      Comments        +----------+------+--------+---------------------+ IVC Prox                Unable to visualize   +----------+------+--------+---------------------+ IVC Mid   patent        Limited visualization +----------+------+--------+---------------------+ IVC Distal       acute                        +----------+------+--------+---------------------+  +-------------------+---------+-----------+---------+-----------+--------+         CIV        RT-PatentRT-ThrombusLT-PatentLT-ThrombusComments +-------------------+---------+-----------+---------+-----------+--------+ Common Iliac Prox   patent                         acute            +-------------------+---------+-----------+---------+-----------+--------+ Common Iliac Mid    patent                         acute            +-------------------+---------+-----------+---------+-----------+--------+ Common Iliac Distal patent                         acute            +-------------------+---------+-----------+---------+-----------+--------+  +-------------------------+---------+-----------+---------+-----------+--------+            EIV            RT-PatentRT-ThrombusLT-PatentLT-ThrombusComments +-------------------------+---------+-----------+---------+-----------+--------+ External Iliac Vein       patent              patent                      Distal                                                                    +-------------------------+---------+-----------+---------+-----------+--------+   Summary: IVC/Iliac: There is evidence of acute thrombus involving the distal IVC. There is no evidence of thrombus involving the right common iliac vein. There is evidence of acute thrombus involving the left common iliac vein. There is no evidence of thrombus involving the right external iliac vein and left external iliac vein.  *See table(s) above for measurements and observations.  Electronically signed by Sherald Hess MD on 10/15/2019 at 2:47:41 PM.  Final    ECHOCARDIOGRAM COMPLETE  Result Date: 10/14/2019    ECHOCARDIOGRAM REPORT   Patient Name:   Christian Stevens Date of Exam: 10/14/2019 Medical Rec #:  [id number]           Height:       73.0 in Accession #:    [id number]          Weight:       322.1 lb Date of Birth:  10-01-1975           BSA:          2.635 m Patient Age:    43 years            BP:           107/83 mmHg Patient Gender: M                   HR:           95 bpm. Exam Location:  Inpatient Procedure: 2D Echo Indications:    pulmonary embolus 415.19  History:        Patient has no prior history of Echocardiogram examinations.                 Risk Factors:Hypertension and Dyslipidemia.  Sonographer:    Delcie Roch Referring Phys: [id number] Deno Lunger SHAL[place] IMPRESSIONS  1. Left ventricular ejection fraction, by estimation, is 55%%. The left ventricle has normal function. The left ventricle has no regional wall motion abnormalities. There is mild left ventricular hypertrophy. Left ventricular diastolic parameters were normal.  2. Right ventricular systolic function is severely reduced. The right  ventricular size is moderately enlarged. There is normal pulmonary artery systolic pressure.  3. The mitral valve is normal in structure. Trivial mitral valve regurgitation.  4. The aortic valve is normal in structure. Aortic valve regurgitation is not visualized.  5. The inferior vena cava is dilated in size with >50% respiratory variability, suggesting right atrial pressure of 8 mmHg. FINDINGS  Left Ventricle: Left ventricular ejection fraction, by estimation, is 55%%. The left ventricle has normal function. The left ventricle has no regional wall motion abnormalities. The left ventricular internal cavity size was normal in size. There is mild  left ventricular hypertrophy. Left ventricular diastolic parameters were normal. Right Ventricle: The right ventricular size is moderately enlarged. Right vetricular wall thickness was not assessed. Right ventricular systolic function is severely reduced. There is normal pulmonary artery systolic pressure. The tricuspid regurgitant velocity is 2.21 m/s, and with an assumed right atrial pressure of 3 mmHg, the estimated right ventricular systolic pressure is 22.5 mmHg. Left Atrium: Left atrial size was normal in size. Right Atrium: Right atrial size was normal in size. Pericardium: There is no evidence of pericardial effusion. Mitral Valve: The mitral valve is normal in structure. Trivial mitral valve regurgitation. Tricuspid Valve: The tricuspid valve is normal in structure. Tricuspid valve regurgitation is mild. Aortic Valve: The aortic valve is normal in structure. Aortic valve regurgitation is not visualized. Pulmonic Valve: The pulmonic valve was normal in structure. Pulmonic valve regurgitation is trivial. Aorta: The aortic root is normal in size and structure. Venous: The inferior vena cava is dilated in size with greater than 50% respiratory variability, suggesting right atrial pressure of 8 mmHg. IAS/Shunts: The interatrial septum was not assessed.  LEFT VENTRICLE  PLAX 2D LVIDd:         5.00 cm  Diastology LVIDs:         3.70 cm  LV e' lateral:   13.30 cm/s LV PW:         1.00 cm  LV E/e' lateral: 4.6 LV IVS:        0.90 cm  LV e' medial:    8.92 cm/s LVOT diam:     2.40 cm  LV E/e' medial:  6.8 LV SV:         62 LV SV Index:   23 LVOT Area:     4.52 cm  RIGHT VENTRICLE            IVC RV S prime:     8.70 cm/s  IVC diam: 2.10 cm TAPSE (M-mode): 1.6 cm LEFT ATRIUM             Index       RIGHT ATRIUM           Index LA diam:        3.80 cm 1.44 cm/m  RA Area:     14.40 cm LA Vol (A2C):   37.5 ml 14.23 ml/m RA Volume:   34.20 ml  12.98 ml/m LA Vol (A4C):   34.5 ml 13.09 ml/m LA Biplane Vol: 37.6 ml 14.27 ml/m  AORTIC VALVE LVOT Vmax:   84.20 cm/s LVOT Vmean:  60.000 cm/s LVOT VTI:    0.136 m  AORTA Ao Root diam: 3.50 cm Ao Asc diam:  3.20 cm MITRAL VALVE               TRICUSPID VALVE MV Area (PHT): 4.36 cm    TV Peak grad:   18.7 mmHg MV Decel Time: 174 msec    TV Vmax:        2.16 m/s MV E velocity: 60.80 cm/s  TR Peak grad:   19.5 mmHg MV A velocity: 70.70 cm/s  TR Vmax:        221.00 cm/s MV E/A ratio:  0.86                            SHUNTS                            Systemic VTI:  0.14 m                            Systemic Diam: 2.40 cm Dietrich Pates MD Electronically signed by Dietrich Pates MD Signature Date/Time: 10/14/2019/12:04:27 PM    Final     ASSESSMENT & PLAN Christian Stevens 44 y.o. male with medical history significant for HTN and HLD who presents for evaluation of a newly diagnosed DVT/PE.  After review of the labs, review the records, discussion with the patient the findings are most consistent with a provoked pulmonary embolism and left lower extremity DVT secondary to May Thurner syndrome.  The patient has been appropriately treated with stenting of the left common and external iliac veins and has been placed on 6 months of anticoagulation therapy with Eliquis.  I am in agreement with the vascular surgery recommendations with the patient  continue for 6 full months of therapy followed by indefinite aspirin 81 mg p.o. daily.  At this time I do not think there is any indication for a hypercoagulation work-up as the May Thurner syndrome is the likely etiology and provoking factor for this patient's blood clots.    In the event he were to develop new  or worsening blood clots we would happily have him back in our clinic to reevaluate.  The patient is currently scheduled for follow-up with vascular surgery later this month and I would recommend that he keep that appointment.  In the event the vascular surgery feels that we need to continue following this patient for anticoagulation management we would be happy to do so.  At this time I do not think there is any need for routine follow-up in our clinic unless requested by the vascular surgery team.  # Provoked Pulmonary Embolism/Lower Extremity DVT # May Thurner Syndrome s/p Stent of left common and external iliac veins --agree with vascular surgery recommendations for 6 months of anticoagulation with therapeutic Eliquis 5mg  PO BID followed by indefinite ASA 81mg  PO daily. --due to insurance issues, patient needs to transition to Xarelto therapy. Will start him on 15mg  PO BID x 21 days followed by 20mg  PO daily.  --no indication for a hypercoagulation workup as May Thurner is the clear provoking etiology for this patient's clot.  --patient to follow up with vascular surgery on 11/23/2019. He can continue to follow with them, though if further f/u with hematology is requested we can happily follow him while he is on anticoagulation.  --RTC PRN.   Orders Placed This Encounter  Procedures  . CBC with Differential (Cancer Center Only)    Standing Status:   Future    Number of Occurrences:   1    Standing Expiration Date:   11/07/2020  . CMP (Cancer Center only)    Standing Status:   Future    Number of Occurrences:   1    Standing Expiration Date:   11/07/2020    All questions were  answered. The patient knows to call the clinic with any problems, questions or concerns.  A total of more than 60 minutes were spent on this encounter and over half of that time was spent on counseling and coordination of care as outlined above.   Ulysees Barns, MD Department of Hematology/Oncology [facility] Phone: [phone] Pager: [phone] Email: [email or web address]  11/08/2019 3:07 PM

## 2019-11-08 ENCOUNTER — Other Ambulatory Visit: Payer: Self-pay

## 2019-11-08 ENCOUNTER — Inpatient Hospital Stay: Payer: Managed Care, Other (non HMO) | Attending: Hematology and Oncology | Admitting: Hematology and Oncology

## 2019-11-08 ENCOUNTER — Inpatient Hospital Stay: Payer: Managed Care, Other (non HMO)

## 2019-11-08 ENCOUNTER — Encounter: Payer: Self-pay | Admitting: Hematology and Oncology

## 2019-11-08 VITALS — BP 133/91 | HR 109 | Temp 98.6°F | Resp 18 | Ht 73.0 in | Wt 312.3 lb

## 2019-11-08 DIAGNOSIS — I2699 Other pulmonary embolism without acute cor pulmonale: Secondary | ICD-10-CM | POA: Diagnosis not present

## 2019-11-08 DIAGNOSIS — E782 Mixed hyperlipidemia: Secondary | ICD-10-CM | POA: Insufficient documentation

## 2019-11-08 DIAGNOSIS — I82402 Acute embolism and thrombosis of unspecified deep veins of left lower extremity: Secondary | ICD-10-CM | POA: Diagnosis not present

## 2019-11-08 DIAGNOSIS — I871 Compression of vein: Secondary | ICD-10-CM | POA: Diagnosis not present

## 2019-11-08 DIAGNOSIS — Z7901 Long term (current) use of anticoagulants: Secondary | ICD-10-CM | POA: Diagnosis not present

## 2019-11-08 DIAGNOSIS — I10 Essential (primary) hypertension: Secondary | ICD-10-CM | POA: Diagnosis not present

## 2019-11-08 LAB — CBC WITH DIFFERENTIAL (CANCER CENTER ONLY)
Abs Immature Granulocytes: 0.04 10*3/uL (ref 0.00–0.07)
Basophils Absolute: 0 10*3/uL (ref 0.0–0.1)
Basophils Relative: 1 %
Eosinophils Absolute: 0.1 10*3/uL (ref 0.0–0.5)
Eosinophils Relative: 2 %
HCT: 41.9 % (ref 39.0–52.0)
Hemoglobin: 13.9 g/dL (ref 13.0–17.0)
Immature Granulocytes: 1 %
Lymphocytes Relative: 47 %
Lymphs Abs: 2.4 10*3/uL (ref 0.7–4.0)
MCH: 28.2 pg (ref 26.0–34.0)
MCHC: 33.2 g/dL (ref 30.0–36.0)
MCV: 85 fL (ref 80.0–100.0)
Monocytes Absolute: 0.8 10*3/uL (ref 0.1–1.0)
Monocytes Relative: 15 %
Neutro Abs: 1.8 10*3/uL (ref 1.7–7.7)
Neutrophils Relative %: 34 %
Platelet Count: 195 10*3/uL (ref 150–400)
RBC: 4.93 MIL/uL (ref 4.22–5.81)
RDW: 14.4 % (ref 11.5–15.5)
WBC Count: 5.1 10*3/uL (ref 4.0–10.5)
nRBC: 0 % (ref 0.0–0.2)

## 2019-11-08 LAB — CMP (CANCER CENTER ONLY)
ALT: 33 U/L (ref 0–44)
AST: 25 U/L (ref 15–41)
Albumin: 3.8 g/dL (ref 3.5–5.0)
Alkaline Phosphatase: 65 U/L (ref 38–126)
Anion gap: 10 (ref 5–15)
BUN: 12 mg/dL (ref 6–20)
CO2: 19 mmol/L — ABNORMAL LOW (ref 22–32)
Calcium: 9.4 mg/dL (ref 8.9–10.3)
Chloride: 106 mmol/L (ref 98–111)
Creatinine: 0.92 mg/dL (ref 0.61–1.24)
GFR, Est AFR Am: >60 mL/min (ref >60)
GFR, Estimated: >60 mL/min (ref >60)
Glucose, Bld: 99 mg/dL (ref 70–99)
Potassium: 3.9 mmol/L (ref 3.5–5.1)
Sodium: 135 mmol/L (ref 135–145)
Total Bilirubin: 0.8 mg/dL (ref 0.3–1.2)
Total Protein: 7.9 g/dL (ref 6.5–8.1)

## 2019-11-08 MED ORDER — RIVAROXABAN (XARELTO) VTE STARTER PACK (15 & 20 MG)
ORAL_TABLET | ORAL | 0 refills | Status: DC
Start: 2019-11-08 — End: 2020-06-19

## 2019-11-08 MED ORDER — RIVAROXABAN 20 MG PO TABS
20.0000 mg | ORAL_TABLET | Freq: Every day | ORAL | 3 refills | Status: DC
Start: 2019-11-08 — End: 2020-06-19

## 2019-11-09 ENCOUNTER — Telehealth: Payer: Self-pay | Admitting: *Deleted

## 2019-11-09 NOTE — Telephone Encounter (Signed)
-----   Message from Jaci Standard, MD sent at 11/08/2019  3:10 PM EDT ----- Please let Christian Stevens know that his blood counts, liver function, and kidney function all look excellent. His Xarelto has been called into his pharmacy (starter pack with 15mg  PO BID x 21 days followed by 20mg  PO daily). He has f/u with vascular surgery on 11/23/2019, no need for more f/u in our clinic. ----- Message ----- From: , Lab In [place] Sent: 11/08/2019   2:05 PM EDT To: [place], MD

## 2019-11-09 NOTE — Telephone Encounter (Signed)
TCT patient regarding lab results. Spoke with patient and advised that his lab results were normal, no issues noted. Dr. Leonides Schanz has called in his Xarelto. Reviewed this with him. Also advised that as he is followed by vascular surgeons, he does not need to come back to this clinic.  He voiced understanding

## 2019-11-23 ENCOUNTER — Ambulatory Visit (HOSPITAL_COMMUNITY)
Admission: RE | Admit: 2019-11-23 | Discharge: 2019-11-23 | Disposition: A | Discharge status: Home / Self Care | Payer: Managed Care, Other (non HMO) | Source: Ambulatory Visit | Attending: Vascular Surgery | Admitting: Vascular Surgery

## 2019-11-23 ENCOUNTER — Ambulatory Visit: Payer: Managed Care, Other (non HMO) | Admitting: Physician Assistant

## 2019-11-23 ENCOUNTER — Other Ambulatory Visit: Payer: Self-pay

## 2019-11-23 VITALS — BP 133/82 | HR 92 | Temp 97.8°F | Resp 16 | Ht 72.0 in | Wt 310.0 lb

## 2019-11-23 DIAGNOSIS — I871 Compression of vein: Secondary | ICD-10-CM

## 2019-11-23 DIAGNOSIS — I82442 Acute embolism and thrombosis of left tibial vein: Secondary | ICD-10-CM

## 2019-11-23 NOTE — Progress Notes (Signed)
Office Note     CC:  follow up Requesting Provider:  Georgann Housekeeper, MD  HPI: Christian Stevens is a 44 y.o. (29-Jan-1976) male who presents status post mechanical thrombectomy of the left popliteal, femoral, common femoral, external and common iliac veins, and IVC with stenting of the left common and external iliac vein by Dr. Randie Heinz on 10/16/2019.  This was due to an extensive DVT and May Thurner syndrome.  Patient was also started on Eliquis postoperatively.  Patient states his leg feels much better.  He is wearing thigh-high compression while at work.  He denies any significant edema or pain of left lower extremity.  He recently saw his oncologist who switched him over to Xarelto due to unaffordable Eliquis co-pay.  He will be on anticoagulation for 6 months and then can switch to aspirin.  Patient states he is having some trouble keeping thigh-high compression stockings from rolling down during the day however is trying several things which seem to be helping some.  He denies tobacco use.   Past Medical History:  Diagnosis Date  . Essential hypertension 10/14/2019  . GERD without esophagitis 10/14/2019  . High cholesterol   . Hypertension   . Mixed hyperlipidemia 10/14/2019    Past Surgical History:  Procedure Laterality Date  . INTRAVASCULAR ULTRASOUND/IVUS Left 10/16/2019   Procedure: Intravascular Ultrasound/IVUS;  Surgeon: Maeola Harman, MD;  Location: [facility] INVASIVE CV LAB;  Service: Cardiovascular;  Laterality: Left;  lower leg IVC  . IVC VENOGRAPHY N/A 10/16/2019   Procedure: IVC Venography;  Surgeon: Maeola Harman, MD;  Location: [facility] INVASIVE CV LAB;  Service: Cardiovascular;  Laterality: N/A;  . PERIPHERAL VASCULAR ATHERECTOMY Left 10/16/2019   Procedure: PERIPHERAL VASCULAR ATHERECTOMY;  Surgeon: Maeola Harman, MD;  Location: [facility] INVASIVE CV LAB;  Service: Cardiovascular;  Laterality: Left;  common/ext iliac /common fem popliteal  . PERIPHERAL  VASCULAR INTERVENTION Left 10/16/2019   Procedure: PERIPHERAL VASCULAR INTERVENTION;  Surgeon: Maeola Harman, MD;  Location: [facility] INVASIVE CV LAB;  Service: Cardiovascular;  Laterality: Left;  COMMON/EXT ILIAC    Social History   Socioeconomic History  . Marital status: Single    Spouse name: Not on file  . Number of children: Not on file  . Years of education: Not on file  . Highest education level: Not on file  Occupational History  . Not on file  Tobacco Use  . Smoking status: Never Smoker  . Smokeless tobacco: Never Used  Vaping Use  . Vaping Use: Never used  Substance and Sexual Activity  . Alcohol use: No  . Drug use: No  . Sexual activity: Not on file  Other Topics Concern  . Not on file  Social History Narrative  . Not on file   Social Determinants of Health   Financial Resource Strain:   . Difficulty of Paying Living Expenses: Not on file  Food Insecurity:   . Worried About Programme researcher, broadcasting/film/video in the Last Year: Not on file  . Ran Out of Food in the Last Year: Not on file  Transportation Needs:   . Lack of Transportation (Medical): Not on file  . Lack of Transportation (Non-Medical): Not on file  Physical Activity:   . Days of Exercise per Week: Not on file  . Minutes of Exercise per Session: Not on file  Stress:   . Feeling of Stress : Not on file  Social Connections:   . Frequency of Communication with Friends and Family: Not on file  .  Frequency of Social Gatherings with Friends and Family: Not on file  . Attends Religious Services: Not on file  . Active Member of Clubs or Organizations: Not on file  . Attends Banker Meetings: Not on file  . Marital Status: Not on file  Intimate Partner Violence:   . Fear of Current or Ex-Partner: Not on file  . Emotionally Abused: Not on file  . Physically Abused: Not on file  . Sexually Abused: Not on file    Family History  Problem Relation Age of Onset  . Hypertension Father   .  Hyperlipidemia Father     Current Outpatient Medications  Medication Sig Dispense Refill  . amLODipine (NORVASC) 5 MG tablet Take 5 mg by mouth daily.    Marland Kitchen losartan (COZAAR) 100 MG tablet Take 100 mg by mouth daily.    Marland Kitchen omeprazole (PRILOSEC OTC) 20 MG tablet Take 20 mg by mouth daily.    Marland Kitchen OVER THE COUNTER MEDICATION Take 1 tablet by mouth daily. Calcium supplement    . RIVAROXABAN (XARELTO) VTE STARTER PACK (15 & 20 MG TABLETS) Follow package directions: Take one 15mg  tablet by mouth twice a day. On day 22, switch to one 20mg  tablet once a day. Take with food. 51 each 0  . rosuvastatin (CRESTOR) 20 MG tablet Take 20 mg by mouth at bedtime.    . rivaroxaban (XARELTO) 20 MG TABS tablet Take 1 tablet (20 mg total) by mouth daily with supper. Begin taking after completing the starter set 15mg  twice daily. (Patient not taking: Reported on 11/23/2019) 30 tablet 3   No current facility-administered medications for this visit.    Allergies  Allergen Reactions  . Lisinopril Cough     REVIEW OF SYSTEMS:   [X]  denotes positive finding, [ ]  denotes negative finding Cardiac  Comments:  Chest pain or chest pressure:    Shortness of breath upon exertion:    Short of breath when lying flat:    Irregular heart rhythm:        Vascular    Pain in calf, thigh, or hip brought on by ambulation:    Pain in feet at night that wakes you up from your sleep:     Blood clot in your veins:    Leg swelling:         Pulmonary    Oxygen at home:    Productive cough:     Wheezing:         Neurologic    Sudden weakness in arms or legs:     Sudden numbness in arms or legs:     Sudden onset of difficulty speaking or slurred speech:    Temporary loss of vision in one eye:     Problems with dizziness:         Gastrointestinal    Blood in stool:     Vomited blood:         Genitourinary    Burning when urinating:     Blood in urine:        Psychiatric    Major depression:         Hematologic     Bleeding problems:    Problems with blood clotting too easily:        Skin    Rashes or ulcers:        Constitutional    Fever or chills:      PHYSICAL EXAMINATION:  Vitals:   11/23/19 0926  BP: 133/82  Pulse: 92  Resp: 16  Temp: 97.8 F (36.6 C)  TempSrc: Temporal  SpO2: 97%  Weight: (!) 310 lb (140.6 kg)  Height: 6' (1.829 m)    General:  WDWN in NAD; vital signs documented above Gait: Not observed HENT: WNL, normocephalic Pulmonary: normal non-labored breathing , without Rales, rhonchi,  wheezing Cardiac: regular HR Abdomen: soft, NT, no masses Skin: without rashes Extremities: without ischemic changes, without Gangrene , without cellulitis; without open wounds; no significant edema of left lower extremity compared to right Musculoskeletal: no muscle wasting or atrophy  Neurologic: A&O X 3;  No focal weakness or paresthesias are detected Psychiatric:  The pt has Normal affect.   Non-Invasive Vascular Imaging:   Patent common and external iliac vein stent   ASSESSMENT/PLAN:: 44 y.o. male who is 6 weeks status post mechanical thrombectomy of left lower extremity and common and external iliac vein stenting due to extensive DVT  -Patient states left leg is much improved since surgery and is managing left leg edema with thigh-high compression while at work and elevation when he gets home -IVC/iliac venous duplex demonstrates a patent left common and external iliac venous stent -Patient will continue Xarelto for a period of 6 months postoperatively then can switch to a baby aspirin per Dr. Randie Heinz -Recheck iliac stent in 6 months per protocol   Emilie Rutter, PA-C [facility] [phone]  Clinic MD:   Randie Heinz

## 2019-11-27 ENCOUNTER — Other Ambulatory Visit: Payer: Self-pay | Admitting: *Deleted

## 2019-11-27 DIAGNOSIS — I871 Compression of vein: Secondary | ICD-10-CM

## 2020-06-18 DIAGNOSIS — D72828 Other elevated white blood cell count: Secondary | ICD-10-CM | POA: Insufficient documentation

## 2020-06-18 DIAGNOSIS — R7303 Prediabetes: Secondary | ICD-10-CM | POA: Insufficient documentation

## 2020-06-18 DIAGNOSIS — E7800 Pure hypercholesterolemia, unspecified: Secondary | ICD-10-CM | POA: Insufficient documentation

## 2020-06-18 DIAGNOSIS — Z6841 Body Mass Index (BMI) 40.0 and over, adult: Secondary | ICD-10-CM | POA: Insufficient documentation

## 2020-06-18 DIAGNOSIS — E785 Hyperlipidemia, unspecified: Secondary | ICD-10-CM | POA: Insufficient documentation

## 2020-06-19 ENCOUNTER — Ambulatory Visit (HOSPITAL_COMMUNITY)
Admission: RE | Admit: 2020-06-19 | Discharge: 2020-06-19 | Disposition: A | Discharge status: Home / Self Care | Payer: Managed Care, Other (non HMO) | Source: Ambulatory Visit | Attending: Vascular Surgery | Admitting: Vascular Surgery

## 2020-06-19 ENCOUNTER — Ambulatory Visit (INDEPENDENT_AMBULATORY_CARE_PROVIDER_SITE_OTHER): Payer: Managed Care, Other (non HMO) | Admitting: Physician Assistant

## 2020-06-19 ENCOUNTER — Other Ambulatory Visit: Payer: Self-pay | Admitting: *Deleted

## 2020-06-19 ENCOUNTER — Encounter: Payer: Self-pay | Admitting: Physician Assistant

## 2020-06-19 ENCOUNTER — Other Ambulatory Visit: Payer: Self-pay

## 2020-06-19 VITALS — BP 145/97 | HR 81 | Temp 98.2°F | Resp 20 | Ht 72.0 in | Wt 323.9 lb

## 2020-06-19 DIAGNOSIS — I82442 Acute embolism and thrombosis of left tibial vein: Secondary | ICD-10-CM | POA: Diagnosis not present

## 2020-06-19 DIAGNOSIS — I871 Compression of vein: Secondary | ICD-10-CM

## 2020-06-19 NOTE — Progress Notes (Signed)
Office Note     CC:  follow up Requesting Provider:  Georgann Housekeeper, MD  HPI: Christian Stevens is a 45 y.o. ([birth year]) male who presents for follow up s/p mechanical thrombectomy of the left popliteal, femoral, common femoral, external and common iliac veins, and IVC with stenting of the left common and external iliac vein by Dr. Randie Heinz on 10/16/2019.This was due to an extensive DVT and May Thurner syndrome. He was discharged on Eliquis but at time of his last visit in August he was switched over to Xarelto due to cost.   He reports today that over the past 1.5 weeks he has been having tingling and pins and needles in his left calf and cramping pain in his right calf daily after he gets off of work and sits in his car. He also describes chest tightness that occurs at the same time. He feels that this is likely anxiety related as he worries about his leg symptoms. He says otherwise while at work or at rest his legs feel fine. His left leg continues to feel much better since the intervention overall as far as swelling and pain. He wears thigh high compression stockings while at work. He denies any significant edema. He notes that two weeks ago he saw his Hem/onc Physician who stopped his Xarelto and transitioned him to baby aspirin daily. He has been compliant with this.   The pt is on a statin for cholesterol management.  The pt is on a daily aspirin.   Other AC: none The pt is on ARB and CCB for hypertension.   The pt is not diabetic.   Tobacco hx: never smoker  Past Medical History:  Diagnosis Date  . Essential hypertension 10/14/2019  . GERD without esophagitis 10/14/2019  . High cholesterol   . Hypertension   . Mixed hyperlipidemia 10/14/2019    Past Surgical History:  Procedure Laterality Date  . INTRAVASCULAR ULTRASOUND/IVUS Left 10/16/2019   Procedure: Intravascular Ultrasound/IVUS;  Surgeon: Maeola Harman, MD;  Location: [facility] INVASIVE CV LAB;  Service:  Cardiovascular;  Laterality: Left;  lower leg IVC  . IVC VENOGRAPHY N/A 10/16/2019   Procedure: IVC Venography;  Surgeon: Maeola Harman, MD;  Location: [facility] INVASIVE CV LAB;  Service: Cardiovascular;  Laterality: N/A;  . PERIPHERAL VASCULAR ATHERECTOMY Left 10/16/2019   Procedure: PERIPHERAL VASCULAR ATHERECTOMY;  Surgeon: Maeola Harman, MD;  Location: [facility] INVASIVE CV LAB;  Service: Cardiovascular;  Laterality: Left;  common/ext iliac /common fem popliteal  . PERIPHERAL VASCULAR INTERVENTION Left 10/16/2019   Procedure: PERIPHERAL VASCULAR INTERVENTION;  Surgeon: Maeola Harman, MD;  Location: [facility] INVASIVE CV LAB;  Service: Cardiovascular;  Laterality: Left;  COMMON/EXT ILIAC    Social History   Socioeconomic History  . Marital status: Single    Spouse name: Not on file  . Number of children: Not on file  . Years of education: Not on file  . Highest education level: Not on file  Occupational History  . Not on file  Tobacco Use  . Smoking status: Never Smoker  . Smokeless tobacco: Never Used  Vaping Use  . Vaping Use: Never used  Substance and Sexual Activity  . Alcohol use: No  . Drug use: No  . Sexual activity: Not on file  Other Topics Concern  . Not on file  Social History Narrative  . Not on file   Social Determinants of Health   Financial Resource Strain: Not on file  Food Insecurity: Not on file  Transportation Needs: Not on file  Physical Activity: Not on file  Stress: Not on file  Social Connections: Not on file  Intimate Partner Violence: Not on file    Family History  Problem Relation Age of Onset  . Hypertension Father   . Hyperlipidemia Father     Current Outpatient Medications  Medication Sig Dispense Refill  . amLODipine (NORVASC) 5 MG tablet Take 5 mg by mouth daily.    Marland Kitchen losartan (COZAAR) 100 MG tablet Take 100 mg by mouth daily.    Marland Kitchen omeprazole (PRILOSEC OTC) 20 MG tablet Take 20 mg by mouth daily.    Marland Kitchen OVER THE  COUNTER MEDICATION Take 1 tablet by mouth daily. Calcium supplement    . rosuvastatin (CRESTOR) 20 MG tablet Take 20 mg by mouth at bedtime.     No current facility-administered medications for this visit.    Allergies  Allergen Reactions  . Hydrochlorothiazide     Other reaction(s): hyponatremia  . Lisinopril Cough  . Simvastatin     Other reaction(s): Unknown     REVIEW OF SYSTEMS:  [X]  denotes positive finding, [ ]  denotes negative finding Cardiac  Comments:  Chest pain or chest pressure:    Shortness of breath upon exertion:    Short of breath when lying flat:    Irregular heart rhythm:        Vascular    Pain in calf, thigh, or hip brought on by ambulation:    Pain in feet at night that wakes you up from your sleep:     Blood clot in your veins:    Leg swelling:         Pulmonary    Oxygen at home:    Productive cough:     Wheezing:         Neurologic    Sudden weakness in arms or legs:     Sudden numbness in arms or legs:     Sudden onset of difficulty speaking or slurred speech:    Temporary loss of vision in one eye:     Problems with dizziness:         Gastrointestinal    Blood in stool:     Vomited blood:         Genitourinary    Burning when urinating:     Blood in urine:        Psychiatric    Major depression:         Hematologic    Bleeding problems:    Problems with blood clotting too easily:        Skin    Rashes or ulcers:        Constitutional    Fever or chills:      PHYSICAL EXAMINATION:  Vitals:   06/19/20 0847  BP: (!) 145/97  Pulse: 81  Resp: 20  Temp: 98.2 F (36.8 C)  TempSrc: Temporal  SpO2: 96%  Weight: (!) 323 lb 14.4 oz (146.9 kg)  Height: 6' (1.829 m)    General:  WDWN in NAD; vital signs documented above Gait: Norrmal HENT: WNL, normocephalic Pulmonary: normal non-labored breathing , without wheezing Cardiac: regular HR, without  Murmurs without carotid bruit Abdomen: soft, NT, no masses Vascular  Exam/Pulses:  Right Left  Radial 2+ (normal) 2+ (normal)  Femoral 2+ (normal) 2+ (normal)  Popliteal 2+ (normal) 2+ (normal)  DP 2+ (normal) 2+ (normal)  PT 2+ (normal) 2+ (normal)   Extremities: without ischemic changes, without Gangrene ,  without cellulitis; without open wounds; no appreciable edema of bilateral lower extremities. No tenderness to palpation Musculoskeletal: no muscle wasting or atrophy  Neurologic: A&O X 3;  No focal weakness or paresthesias are detected Psychiatric:  The pt has Normal affect.   Non-Invasive Vascular Imaging:   06/19/20 IVC/Iliac: The visualized segment of the IVC appears patent. The left common and external iliac stent appears patent, limited visualization. The right common and external iliac vein appears patent.    ASSESSMENT/PLAN:: 45 y.o. male here for follow up for follow up s/p mechanical thrombectomy of the left popliteal, femoral, common femoral, external and common iliac veins, and IVC with stenting of the left common and external iliac vein by Dr. Randie Heinz on 10/16/2019.This was due to an extensive DVT and May Thurner syndrome. He recently discontinued his Xarelto 2 weeks ago and 1.5 weeks ago he began having new symptoms in BLE. I do not feel that this is likely new DVT however with his history and stopping his anticoagulation I will schedule him for a BLE DVT duplex and have him follow up on Monday 3/28 - IVC/ Iliac duplex today demonstrates a patent IVC, left common and external iliac stent - continue thigh high compression stockings and elevation PRN - Continue Aspirin 81 mg daily - If his duplex on Monday does not show any new DVT that would require him to restart his Xarelto he can follow up in 6 months with IVC/ iliac vein duplex per protocol   Graceann Congress, PA-C [facility] [phone]  Clinic MD: Veda Canning

## 2020-06-23 ENCOUNTER — Ambulatory Visit (HOSPITAL_COMMUNITY)
Admission: RE | Admit: 2020-06-23 | Discharge: 2020-06-23 | Disposition: A | Discharge status: Home / Self Care | Payer: Managed Care, Other (non HMO) | Source: Ambulatory Visit | Attending: Vascular Surgery | Admitting: Vascular Surgery

## 2020-06-23 ENCOUNTER — Other Ambulatory Visit: Payer: Self-pay

## 2020-06-23 DIAGNOSIS — I82442 Acute embolism and thrombosis of left tibial vein: Secondary | ICD-10-CM

## 2020-06-24 ENCOUNTER — Encounter: Payer: Self-pay | Admitting: Physician Assistant

## 2020-06-24 ENCOUNTER — Ambulatory Visit: Payer: Managed Care, Other (non HMO) | Admitting: Physician Assistant

## 2020-06-24 VITALS — BP 146/103 | HR 101 | Temp 98.6°F | Resp 20 | Ht 72.0 in | Wt 326.8 lb

## 2020-06-24 DIAGNOSIS — I871 Compression of vein: Secondary | ICD-10-CM | POA: Diagnosis not present

## 2020-06-24 DIAGNOSIS — M7989 Other specified soft tissue disorders: Secondary | ICD-10-CM

## 2020-06-24 DIAGNOSIS — I82442 Acute embolism and thrombosis of left tibial vein: Secondary | ICD-10-CM

## 2020-06-24 NOTE — Progress Notes (Signed)
Office Note     CC:  follow up Requesting Provider:  Georgann Housekeeper, MD  HPI: Christian Stevens is a 45 y.o. (January 26, 1976) male who presents for close follow up after new BLE symptoms. He was just seen on 3/24 by myself and he had he reported 1.5 weeks of tingling and pins and needles in his left calf and cramping pain in his right calf daily after he gets off of work and sits in his car. He also described chest tightness that occurs at the same time. He felt that this is likely anxiety related as he worries about his leg symptoms. He says since his last visit this seems to be a little bit better. He says otherwise while at work or at rest his legs feel fine. His left leg continues to feel much better since the intervention overall as far as swelling and pain. He wears thigh high compression stockings while at work. He denies any significant edema.   He has history mechanical thrombectomy of the left popliteal, femoral, common femoral, external and common iliac veins, and IVC with stenting of the left common and external iliac vein by Dr. Randie Heinz on 10/16/2019.This was due to an extensive DVT and May Thurner syndrome. He was discharged on Eliquis and eventually was switched to Xarelto due to cost. He stopped taking his Xarelto at instruction of Hem/ Onc Physician 2 weeks ago. He has been compliant with taking his Aspirin.  The pt is on a statin for cholesterol management.  The pt is on a daily aspirin.   Other AC: none The pt is on ARB and CCB for hypertension.   The pt is not diabetic.   Tobacco hx: never smoker  Past Medical History:  Diagnosis Date  . Essential hypertension 10/14/2019  . GERD without esophagitis 10/14/2019  . High cholesterol   . Hypertension   . Mixed hyperlipidemia 10/14/2019    Past Surgical History:  Procedure Laterality Date  . INTRAVASCULAR ULTRASOUND/IVUS Left 10/16/2019   Procedure: Intravascular Ultrasound/IVUS;  Surgeon: Maeola Harman, MD;   Location: [facility] INVASIVE CV LAB;  Service: Cardiovascular;  Laterality: Left;  lower leg IVC  . IVC VENOGRAPHY N/A 10/16/2019   Procedure: IVC Venography;  Surgeon: Maeola Harman, MD;  Location: [facility] INVASIVE CV LAB;  Service: Cardiovascular;  Laterality: N/A;  . PERIPHERAL VASCULAR ATHERECTOMY Left 10/16/2019   Procedure: PERIPHERAL VASCULAR ATHERECTOMY;  Surgeon: Maeola Harman, MD;  Location: [facility] INVASIVE CV LAB;  Service: Cardiovascular;  Laterality: Left;  common/ext iliac /common fem popliteal  . PERIPHERAL VASCULAR INTERVENTION Left 10/16/2019   Procedure: PERIPHERAL VASCULAR INTERVENTION;  Surgeon: Maeola Harman, MD;  Location: [facility] INVASIVE CV LAB;  Service: Cardiovascular;  Laterality: Left;  COMMON/EXT ILIAC    Social History   Socioeconomic History  . Marital status: Single    Spouse name: Not on file  . Number of children: Not on file  . Years of education: Not on file  . Highest education level: Not on file  Occupational History  . Not on file  Tobacco Use  . Smoking status: Never Smoker  . Smokeless tobacco: Never Used  Vaping Use  . Vaping Use: Never used  Substance and Sexual Activity  . Alcohol use: No  . Drug use: No  . Sexual activity: Not on file  Other Topics Concern  . Not on file  Social History Narrative  . Not on file   Social Determinants of Health   Financial Resource Strain: Not on  file  Food Insecurity: Not on file  Transportation Needs: Not on file  Physical Activity: Not on file  Stress: Not on file  Social Connections: Not on file  Intimate Partner Violence: Not on file    Family History  Problem Relation Age of Onset  . Hypertension Father   . Hyperlipidemia Father     Current Outpatient Medications  Medication Sig Dispense Refill  . amLODipine (NORVASC) 5 MG tablet Take 5 mg by mouth daily.    Marland Kitchen aspirin EC 81 MG tablet Take 81 mg by mouth daily. Swallow whole.    . losartan (COZAAR) 100 MG tablet Take 100  mg by mouth daily.    Marland Kitchen omeprazole (PRILOSEC OTC) 20 MG tablet Take 20 mg by mouth daily.    Marland Kitchen OVER THE COUNTER MEDICATION Take 1 tablet by mouth daily. Calcium supplement    . rosuvastatin (CRESTOR) 20 MG tablet Take 20 mg by mouth at bedtime.     No current facility-administered medications for this visit.    Allergies  Allergen Reactions  . Hydrochlorothiazide     Other reaction(s): hyponatremia  . Lisinopril Cough  . Simvastatin     Other reaction(s): Unknown     REVIEW OF SYSTEMS:  [X]  denotes positive finding, [ ]  denotes negative finding Cardiac  Comments:  Chest pain or chest pressure:    Shortness of breath upon exertion:    Short of breath when lying flat:    Irregular heart rhythm:        Vascular    Pain in calf, thigh, or hip brought on by ambulation:    Pain in feet at night that wakes you up from your sleep:     Blood clot in your veins:    Leg swelling:         Pulmonary    Oxygen at home:    Productive cough:     Wheezing:         Neurologic    Sudden weakness in arms or legs:     Sudden numbness in arms or legs:     Sudden onset of difficulty speaking or slurred speech:    Temporary loss of vision in one eye:     Problems with dizziness:         Gastrointestinal    Blood in stool:     Vomited blood:         Genitourinary    Burning when urinating:     Blood in urine:        Psychiatric    Major depression:         Hematologic    Bleeding problems:    Problems with blood clotting too easily:        Skin    Rashes or ulcers:        Constitutional    Fever or chills:      PHYSICAL EXAMINATION:  Vitals:   06/24/20 1119  BP: (!) 146/103  Pulse: (!) 101  Resp: 20  Temp: 98.6 F (37 C)  TempSrc: Temporal  SpO2: 99%  Weight: (!) 326 lb 12.8 oz (148.2 kg)  Height: 6' (1.829 m)    General:  WDWN in NAD; vital signs documented above Gait: Norrmal HENT: WNL, normocephalic Pulmonary: normal non-labored breathing , without  wheezing Cardiac: regular HR, without  Murmurs without carotid bruit Abdomen: soft, NT, no masses Vascular Exam/Pulses:  Right Left  Radial 2+ (normal) 2+ (normal)  Femoral 2+ (normal) 2+ (normal)  Popliteal 2+ (normal) 2+ (normal)  DP 2+ (normal) 2+ (normal)  PT 2+ (normal) 2+ (normal)   Extremities: without ischemic changes, without Gangrene , without cellulitis; without open wounds; no appreciable edema of bilateral lower extremities. No tenderness to palpation Musculoskeletal: no muscle wasting or atrophy  Neurologic: A&O X 3;  No focal weakness or paresthesias are detected Psychiatric:  The pt has Normal affect.   Non-Invasive Vascular Imaging:   06/19/20 IVC/Iliac: The visualized segment of the IVC appears patent. The left common and external iliac stent appears patent, limited visualization. The right common and external iliac vein appears patent.   06/23/20 VAS lower extremity venous duplex showed no acute DVT BLE. No chronic DVT RLE.  There is evidence of chronic deep vein thrombosis in the distal peroneal vein of the left lower extremity that is partially occlusive.    ASSESSMENT/PLAN:: 45 y.o. male here for follow up new bilateral lower extremity symptoms. His symptoms have improved some over past week. He is s/p mechanical thrombectomy of the left popliteal, femoral, common femoral, external and common iliac veins, and IVC with stenting of the left common and external iliac vein by Dr. Randie Heinz on 10/16/2019. This was due to an extensive DVT and May Thurner syndrome. He recently discontinued his Xarelto 2 weeks ago. Reviewed results from todays study with patient and reassurance provided - IVC/ Iliac duplex 06/19/20 demonstrated a patent IVC, left common and external iliac stent - Venous Duplex BLE today shows no acute DVT. Non occlusive chronic DVT of left peroneal vein - continue thigh high compression stockings and elevation PRN - Continue Aspirin 81 mg daily -  he can follow up  in 3 months with IVC/ iliac vein duplex per protocol ( this will be his annual duplex evaluation post stent)   Graceann Congress, PA-C [facility] [phone]  Clinic MD: Steve Rattler

## 2020-06-25 ENCOUNTER — Other Ambulatory Visit: Payer: Self-pay

## 2020-06-25 DIAGNOSIS — I82442 Acute embolism and thrombosis of left tibial vein: Secondary | ICD-10-CM

## 2020-09-22 ENCOUNTER — Other Ambulatory Visit: Payer: Self-pay

## 2020-09-22 ENCOUNTER — Ambulatory Visit (HOSPITAL_COMMUNITY)
Admission: RE | Admit: 2020-09-22 | Discharge: 2020-09-22 | Disposition: A | Discharge status: Home / Self Care | Payer: Managed Care, Other (non HMO) | Source: Ambulatory Visit | Attending: Surgery | Admitting: Surgery

## 2020-09-22 ENCOUNTER — Ambulatory Visit: Payer: Managed Care, Other (non HMO) | Admitting: Physician Assistant

## 2020-09-22 VITALS — BP 141/98 | HR 84 | Temp 98.2°F | Resp 20 | Ht 72.0 in | Wt 327.2 lb

## 2020-09-22 DIAGNOSIS — I871 Compression of vein: Secondary | ICD-10-CM

## 2020-09-22 DIAGNOSIS — I82442 Acute embolism and thrombosis of left tibial vein: Secondary | ICD-10-CM | POA: Diagnosis not present

## 2020-09-22 NOTE — Progress Notes (Signed)
Office Note     CC:  follow up Requesting Provider:  Georgann Housekeeper, MD  HPI: Christian Stevens is a 45 y.o. (09/26/1975) male who presents for routine follow up s/p mechanical thrombectomy of his left popliteal, femoral, common femoral, external and common iliac veins and IVC with stenting of the left common and external iliac vein by Dr. Randie Heinz on 10/16/19. This was for extensive DVT and May Thurner syndrome.  He was on Xarelto, but this was discontinued at instruction of Hem/onc  and instructed to take daily asa.    He was last seen in March 2022 and at that time, he was doing well with his the left leg but was having some right leg cramping daily when he would get off work and sitting in his car.   He was taking his asa.    He remains compliant with his daily asa.  He has not had any issues with his legs.  His right leg pain has improved.  He wears his thigh high compression and gets occasional swelling if he is on his feet for 10-12 hour days.  He states he elevates his legs.  The pt is on a statin for cholesterol management. The pt is on a daily aspirin.   Other AC: none The pt is on ARB and CCB for hypertension.   The pt is not diabetic.   Tobacco hx: never smoker  There is no family hx of AAA  Past Medical History:  Diagnosis Date   Essential hypertension 10/14/2019   GERD without esophagitis 10/14/2019   High cholesterol    Hypertension    Mixed hyperlipidemia 10/14/2019    Past Surgical History:  Procedure Laterality Date   INTRAVASCULAR ULTRASOUND/IVUS Left 10/16/2019   Procedure: Intravascular Ultrasound/IVUS;  Surgeon: Maeola Harman, MD;  Location: [facility] INVASIVE CV LAB;  Service: Cardiovascular;  Laterality: Left;  lower leg IVC   IVC VENOGRAPHY N/A 10/16/2019   Procedure: IVC Venography;  Surgeon: Maeola Harman, MD;  Location: [facility] INVASIVE CV LAB;  Service: Cardiovascular;  Laterality: N/A;   PERIPHERAL VASCULAR ATHERECTOMY Left 10/16/2019    Procedure: PERIPHERAL VASCULAR ATHERECTOMY;  Surgeon: Maeola Harman, MD;  Location: [facility] INVASIVE CV LAB;  Service: Cardiovascular;  Laterality: Left;  common/ext iliac /common fem popliteal   PERIPHERAL VASCULAR INTERVENTION Left 10/16/2019   Procedure: PERIPHERAL VASCULAR INTERVENTION;  Surgeon: Maeola Harman, MD;  Location: [facility] INVASIVE CV LAB;  Service: Cardiovascular;  Laterality: Left;  COMMON/EXT ILIAC    Social History   Socioeconomic History   Marital status: Single    Spouse name: Not on file   Number of children: Not on file   Years of education: Not on file   Highest education level: Not on file  Occupational History   Not on file  Tobacco Use   Smoking status: Never   Smokeless tobacco: Never  Vaping Use   Vaping Use: Never used  Substance and Sexual Activity   Alcohol use: No   Drug use: No   Sexual activity: Not on file  Other Topics Concern   Not on file  Social History Narrative   Not on file   Social Determinants of Health   Financial Resource Strain: Not on file  Food Insecurity: Not on file  Transportation Needs: Not on file  Physical Activity: Not on file  Stress: Not on file  Social Connections: Not on file  Intimate Partner Violence: Not on file    Family History  Problem Relation  Age of Onset   Hypertension Father    Hyperlipidemia Father     Current Outpatient Medications  Medication Sig Dispense Refill   amLODipine (NORVASC) 5 MG tablet Take 5 mg by mouth daily.     aspirin EC 81 MG tablet Take 81 mg by mouth daily. Swallow whole.     losartan (COZAAR) 100 MG tablet Take 100 mg by mouth daily.     omeprazole (PRILOSEC OTC) 20 MG tablet Take 20 mg by mouth daily.     OVER THE COUNTER MEDICATION Take 1 tablet by mouth daily. Calcium supplement     rosuvastatin (CRESTOR) 20 MG tablet Take 20 mg by mouth at bedtime.     No current facility-administered medications for this visit.    Allergies  Allergen Reactions    Hydrochlorothiazide     Other reaction(s): hyponatremia   Lisinopril Cough   Simvastatin     Other reaction(s): Unknown     REVIEW OF SYSTEMS:   [X]  denotes positive finding, [ ]  denotes negative finding Cardiac  Comments:  Chest pain or chest pressure:    Shortness of breath upon exertion:    Short of breath when lying flat:    Irregular heart rhythm:        Vascular    Pain in calf, thigh, or hip brought on by ambulation:    Pain in feet at night that wakes you up from your sleep:     Blood clot in your veins: x   Leg swelling:  x       Pulmonary    Oxygen at home:    Productive cough:     Wheezing:         Neurologic    Sudden weakness in arms or legs:     Sudden numbness in arms or legs:     Sudden onset of difficulty speaking or slurred speech:    Temporary loss of vision in one eye:     Problems with dizziness:         Gastrointestinal    Blood in stool:     Vomited blood:         Genitourinary    Burning when urinating:     Blood in urine:        Psychiatric    Major depression:         Hematologic    Bleeding problems:    Problems with blood clotting too easily:        Skin    Rashes or ulcers:        Constitutional    Fever or chills:      PHYSICAL EXAMINATION:  Vitals:   09/22/20 0815  BP: (!) 141/98  Pulse: 84  Resp: 20  Temp: 98.2 F (36.8 C)  TempSrc: Temporal  SpO2: 95%  Weight: (!) 327 lb 3.2 oz (148.4 kg)  Height: 6' (1.829 m)    General:  WDWN in NAD; vital signs documented above Gait: Not observed HENT: WNL, normocephalic Pulmonary: normal non-labored breathing , without Rales, rhonchi,  wheezing Cardiac: regular HR, without  Murmurs without carotid bruits Abdomen: soft, NT, no masses Skin: without rashes Vascular Exam/Pulses:  Right Left  Radial 2+ (normal) 2+ (normal)  Popliteal Unable to palpate Unable to palpate  AT Brisk biphasic doppler Brisk biphasic doppler  PT Brisk biphasic doppler Brisk biphasic doppler    Extremities: without ischemic changes, without Gangrene , without cellulitis; without open wounds;  Musculoskeletal: no muscle wasting or atrophy  Neurologic: A&O X 3;  No focal weakness or paresthesias are detected Psychiatric:  The pt has Normal affect.   Non-Invasive Vascular Imaging:   IVC/Iliac Findings:  +----------+------+--------+--------+     IVC    PatentThrombusComments  +----------+------+--------+--------+  IVC Prox  patent                  +----------+------+--------+--------+  IVC Mid   patent                  +----------+------+--------+--------+  IVC Distalpatent                  +----------+------+--------+--------+      +-------------------+---------+-----------+--------+        Lt CIV       LT-PatentLT-ThrombusComments  +-------------------+---------+-----------+--------+  Common Iliac Prox   patent                       +-------------------+---------+-----------+--------+  Common Iliac Mid    patent                       +-------------------+---------+-----------+--------+  Common Iliac Distal patent                       +-------------------+---------+-----------+--------+    +-----------------------------+-----------+--------+            LT-Patent          LT-ThrombusComments  +-----------------------------+-----------+--------+   External iliac prox- patent                      +-----------------------------+-----------+--------+    External iliac mid-patent                       +-----------------------------+-----------+--------+  External iliac distal- patent                     +-----------------------------+-----------+--------+    ASSESSMENT/PLAN:: 45 y.o. male here for follow up for hx of mechanical thrombectomy of his left popliteal, femoral, common femoral, external and common iliac veins and IVC with stenting of the left common and external iliac vein by Dr. Randie Heinz on 10/16/19. This was  for extensive DVT and May Thurner syndrome.    - IVC/ Iliac duplex today demonstrates a patent IVC, left common and external iliac stent - Venous Duplex BLE today shows no acute DVT. Non occlusive chronic DVT of left peroneal vein - continue thigh high compression stockings and elevation PRN - Continue Aspirin 81 mg daily -He will follow up in 1 year with repeat IVC/ iliac vein duplex   Graceann Congress, PA-C [facility] [phone]  Clinic MD:  Myra Gianotti

## 2021-04-12 ENCOUNTER — Emergency Department (HOSPITAL_BASED_OUTPATIENT_CLINIC_OR_DEPARTMENT_OTHER): Payer: Managed Care, Other (non HMO)

## 2021-04-12 ENCOUNTER — Emergency Department (HOSPITAL_BASED_OUTPATIENT_CLINIC_OR_DEPARTMENT_OTHER)
Admission: EM | Admit: 2021-04-12 | Discharge: 2021-04-12 | Disposition: A | Discharge status: Home / Self Care | Payer: Managed Care, Other (non HMO) | Attending: Emergency Medicine | Admitting: Emergency Medicine

## 2021-04-12 ENCOUNTER — Other Ambulatory Visit: Payer: Self-pay

## 2021-04-12 ENCOUNTER — Encounter (HOSPITAL_BASED_OUTPATIENT_CLINIC_OR_DEPARTMENT_OTHER): Payer: Self-pay | Admitting: Emergency Medicine

## 2021-04-12 DIAGNOSIS — Z20822 Contact with and (suspected) exposure to covid-19: Secondary | ICD-10-CM | POA: Insufficient documentation

## 2021-04-12 DIAGNOSIS — E860 Dehydration: Secondary | ICD-10-CM | POA: Insufficient documentation

## 2021-04-12 DIAGNOSIS — F419 Anxiety disorder, unspecified: Secondary | ICD-10-CM | POA: Insufficient documentation

## 2021-04-12 DIAGNOSIS — Z7982 Long term (current) use of aspirin: Secondary | ICD-10-CM | POA: Diagnosis not present

## 2021-04-12 DIAGNOSIS — N179 Acute kidney failure, unspecified: Secondary | ICD-10-CM | POA: Diagnosis not present

## 2021-04-12 DIAGNOSIS — I10 Essential (primary) hypertension: Secondary | ICD-10-CM | POA: Diagnosis not present

## 2021-04-12 DIAGNOSIS — R072 Precordial pain: Secondary | ICD-10-CM | POA: Diagnosis not present

## 2021-04-12 DIAGNOSIS — Z79899 Other long term (current) drug therapy: Secondary | ICD-10-CM | POA: Insufficient documentation

## 2021-04-12 DIAGNOSIS — R079 Chest pain, unspecified: Secondary | ICD-10-CM

## 2021-04-12 DIAGNOSIS — M79605 Pain in left leg: Secondary | ICD-10-CM | POA: Insufficient documentation

## 2021-04-12 LAB — BASIC METABOLIC PANEL
Anion gap: 11 (ref 5–15)
BUN: 23 mg/dL — ABNORMAL HIGH (ref 6–20)
CO2: 22 mmol/L (ref 22–32)
Calcium: 9.3 mg/dL (ref 8.9–10.3)
Chloride: 100 mmol/L (ref 98–111)
Creatinine, Ser: 1.56 mg/dL — ABNORMAL HIGH (ref 0.61–1.24)
GFR, Estimated: 55 mL/min — ABNORMAL LOW (ref >60)
Glucose, Bld: 109 mg/dL — ABNORMAL HIGH (ref 70–99)
Potassium: 3.9 mmol/L (ref 3.5–5.1)
Sodium: 133 mmol/L — ABNORMAL LOW (ref 135–145)

## 2021-04-12 LAB — CBC
HCT: 46.5 % (ref 39.0–52.0)
Hemoglobin: 15.8 g/dL (ref 13.0–17.0)
MCH: 28.1 pg (ref 26.0–34.0)
MCHC: 34 g/dL (ref 30.0–36.0)
MCV: 82.7 fL (ref 80.0–100.0)
Platelets: 221 10*3/uL (ref 150–400)
RBC: 5.62 MIL/uL (ref 4.22–5.81)
RDW: 14.9 % (ref 11.5–15.5)
WBC: 7.7 10*3/uL (ref 4.0–10.5)
nRBC: 0 % (ref 0.0–0.2)

## 2021-04-12 LAB — URINALYSIS, ROUTINE W REFLEX MICROSCOPIC
Bilirubin Urine: NEGATIVE
Glucose, UA: NEGATIVE mg/dL
Hgb urine dipstick: NEGATIVE
Ketones, ur: 40 mg/dL — AB
Leukocytes,Ua: NEGATIVE
Nitrite: NEGATIVE
Protein, ur: 30 mg/dL — AB
Specific Gravity, Urine: 1.025 (ref 1.005–1.030)
pH: 5.5 (ref 5.0–8.0)

## 2021-04-12 LAB — URINALYSIS, MICROSCOPIC (REFLEX)

## 2021-04-12 LAB — RESP PANEL BY RT-PCR (FLU A&B, COVID) ARPGX2
Influenza A by PCR: NEGATIVE
Influenza B by PCR: NEGATIVE
SARS Coronavirus 2 by RT PCR: NEGATIVE

## 2021-04-12 LAB — TROPONIN I (HIGH SENSITIVITY)
Troponin I (High Sensitivity): 4 ng/L (ref <18)
Troponin I (High Sensitivity): 3 ng/L (ref <18)

## 2021-04-12 MED ORDER — SODIUM CHLORIDE 0.9 % IV BOLUS
1000.0000 mL | Freq: Once | INTRAVENOUS | Status: AC
  Administered 2021-04-12: 1000 mL via INTRAVENOUS

## 2021-04-12 MED ORDER — IOHEXOL 350 MG/ML SOLN
100.0000 mL | Freq: Once | INTRAVENOUS | Status: AC | PRN
  Administered 2021-04-12: 100 mL via INTRAVENOUS

## 2021-04-12 NOTE — Discharge Instructions (Addendum)
Your work-up today was overall reassuring aside from the dehydration and elevated kidney function called acute kidney injury today.  Ultrasound of your leg showed no blood clot and the CT of your chest was reassuring with no large blood clot.  Your labs showed some dehydration but no evidence of acute infection.  Your work-up was otherwise reassuring as we discussed.  I suspect he may have had a viral infection leading to your overall illness with the nausea, vomiting, and diarrhea however please rest and stay hydrated.  Please follow-up with your primary doctor.  If any symptoms change or worsen acute, please return to the nearest emergency department.

## 2021-04-12 NOTE — ED Triage Notes (Signed)
Pt arrives pov with c/o radiating CP to right arm and upper back x 4 days with shob. Endorses n/v yesterday. Also report LLE pain

## 2021-04-12 NOTE — ED Provider Notes (Signed)
[place] EMERGENCY DEPARTMENT Provider Note   CSN: [id number] Arrival date & time: 04/12/21  1523     History  Chief Complaint  Patient presents with   Chest Pain    Christian Stevens is a 46 y.o. male.  The history is provided by the patient, the spouse and medical records. No language interpreter was used.  Chest Pain Pain location:  Substernal area, L chest and R chest Pain quality: aching and tightness   Pain radiates to:  L shoulder and R shoulder Pain severity:  Moderate Onset quality:  Gradual Duration:  4 days Timing:  Intermittent Progression:  Waxing and waning Chronicity:  New Relieved by:  Nothing Worsened by:  Deep breathing Ineffective treatments:  None tried Associated symptoms: anxiety, diaphoresis, fatigue, nausea, shortness of breath and vomiting   Associated symptoms: no abdominal pain, no altered mental status, no back pain, no cough, no dizziness, no fever, no lower extremity edema, no numbness, no palpitations, no syncope and no weakness   Risk factors: prior DVT/PE       Home Medications Prior to Admission medications   Medication Sig Start Date End Date Taking? Authorizing Provider  amLODipine (NORVASC) 5 MG tablet Take 5 mg by mouth daily. 10/22/19   [provider]  aspirin EC 81 MG tablet Take 81 mg by mouth daily. Swallow whole.    [provider]  losartan (COZAAR) 100 MG tablet Take 100 mg by mouth daily. 10/12/19   [provider]  omeprazole (PRILOSEC OTC) 20 MG tablet Take 20 mg by mouth daily.    [provider]  OVER THE COUNTER MEDICATION Take 1 tablet by mouth daily. Calcium supplement    [provider]  rosuvastatin (CRESTOR) 20 MG tablet Take 20 mg by mouth at bedtime. 10/23/19   [provider]      Allergies    Hydrochlorothiazide, Lisinopril, and Simvastatin    Review of Systems   Review of Systems  Constitutional:  Positive for diaphoresis and  fatigue. Negative for appetite change, chills and fever.  HENT:  Negative for congestion.   Eyes:  Negative for visual disturbance.  Respiratory:  Positive for shortness of breath. Negative for cough, chest tightness, wheezing and stridor.   Cardiovascular:  Positive for chest pain. Negative for palpitations, leg swelling and syncope.  Gastrointestinal:  Positive for nausea and vomiting. Negative for abdominal pain, constipation and diarrhea.  Genitourinary:  Negative for dysuria, flank pain and frequency.  Musculoskeletal:  Negative for back pain, neck pain and neck stiffness.  Skin:  Negative for rash and wound.  Neurological:  Negative for dizziness, weakness and numbness.  Psychiatric/Behavioral:  Negative for agitation and confusion.   All other systems reviewed and are negative.  Physical Exam Updated Vital Signs BP 135/84    Pulse (!) 109    Temp 98.9 F (37.2 C) (Oral)    Resp 18    Ht 6' (1.829 m)    Wt 119.7 kg    SpO2 100%    BMI 35.80 kg/m  Physical Exam Vitals and nursing note reviewed.  Constitutional:      General: He is not in acute distress.    Appearance: He is well-developed. He is not ill-appearing, toxic-appearing or diaphoretic.  HENT:     Head: Normocephalic and atraumatic.  Eyes:     Conjunctiva/sclera: Conjunctivae normal.  Cardiovascular:     Rate and Rhythm: Regular rhythm. Tachycardia present.     Heart sounds: Normal heart sounds.  No murmur heard. Pulmonary:     Effort: Pulmonary effort is normal. No tachypnea or respiratory distress.     Breath sounds: Normal breath sounds. No wheezing, rhonchi or rales.  Chest:     Chest wall: Tenderness present.  Abdominal:     Palpations: Abdomen is soft.     Tenderness: There is no abdominal tenderness.  Musculoskeletal:        General: No swelling.     Cervical back: Neck supple.     Left lower leg: Tenderness present. No edema.  Skin:    General: Skin is warm and dry.     Capillary Refill: Capillary  refill takes less than 2 seconds.     Findings: No erythema.  Neurological:     General: No focal deficit present.     Mental Status: He is alert.  Psychiatric:        Mood and Affect: Mood is anxious.    ED Results / Procedures / Treatments   Labs (all labs ordered are listed, but only abnormal results are displayed) Labs Reviewed  BASIC METABOLIC PANEL - Abnormal; Notable for the following components:      Result Value   Sodium 133 (*)    Glucose, Bld 109 (*)    BUN 23 (*)    Creatinine, Ser 1.56 (*)    GFR, Estimated 55 (*)    All other components within normal limits  URINALYSIS, ROUTINE W REFLEX MICROSCOPIC - Abnormal; Notable for the following components:   Ketones, ur 40 (*)    Protein, ur 30 (*)    All other components within normal limits  URINALYSIS, MICROSCOPIC (REFLEX) - Abnormal; Notable for the following components:   Bacteria, UA FEW (*)    All other components within normal limits  RESP PANEL BY RT-PCR (FLU A&B, COVID) [id number]  URINE CULTURE  CBC  TROPONIN I (HIGH SENSITIVITY)  TROPONIN I (HIGH SENSITIVITY)    EKG EKG Interpretation  Date/Time:  Sunday April 12 2021 15:32:46 EST Ventricular Rate:  112 PR Interval:  160 QRS Duration: 84 QT Interval:  310 QTC Calculation: 423 R Axis:   32 Text Interpretation: Sinus tachycardia Otherwise normal ECG When compared with ECG of 14-Oct-2019 01:04, PREVIOUS ECG IS PRESENT When compared to prior, similar tachycardia. NO STEMI Confirmed by Antony Blackbird [phone]) on 04/12/2021 6:18:33 PM  Radiology DG Chest 2 View  Result Date: 04/12/2021 CLINICAL DATA:  Short of breath, chest and back pain radiating to right arm for 4 days EXAM: CHEST - 2 VIEW COMPARISON:  01/31/2013 FINDINGS: The heart size and mediastinal contours are within normal limits. Both lungs are clear. The visualized skeletal structures are unremarkable. IMPRESSION: No active cardiopulmonary disease. Electronically Signed   By: Randa Ngo M.D.    On: 04/12/2021 15:53   CT Angio Chest PE W and/or Wo Contrast  Result Date: 04/12/2021 CLINICAL DATA:  Suspected pulmonary embolus. EXAM: CT ANGIOGRAPHY CHEST WITH CONTRAST TECHNIQUE: Multidetector CT imaging of the chest was performed using the standard protocol during bolus administration of intravenous contrast. Multiplanar CT image reconstructions and MIPs were obtained to evaluate the vascular anatomy. RADIATION DOSE REDUCTION: This exam was performed according to the departmental dose-optimization program which includes automated exposure control, adjustment of the mA and/or kV according to patient size and/or use of iterative reconstruction technique. CONTRAST:  11mL OMNIPAQUE IOHEXOL 350 MG/ML SOLN COMPARISON:  October 14, 2019 FINDINGS: Cardiovascular: Suboptimal opacification of the distal pulmonary arteries. No evidence of central pulmonary embolus. Normal heart  size. No pericardial effusion. Calcific atherosclerotic disease of the coronary arteries. Mediastinum/Nodes: No enlarged mediastinal, hilar, or axillary lymph nodes. Thyroid gland, trachea, and esophagus demonstrate no significant findings. Lungs/Pleura: Lungs are clear. No pleural effusion or pneumothorax. Upper Abdomen: No acute abnormality. Musculoskeletal: Review of the MIP images confirms the above findings. IMPRESSION: 1. Suboptimal opacification of the distal pulmonary arteries. No evidence of central pulmonary embolus. 2. Calcific atherosclerotic disease of the coronary arteries. 3. Clear lungs. Electronically Signed   By: Fidela Salisbury M.D.   On: 04/12/2021 18:32   US Venous Img Lower Unilateral Left  Result Date: 04/12/2021 CLINICAL DATA:  May-Thurner disease, left leg pain, history of DVT EXAM: LEFT LOWER EXTREMITY VENOUS DOPPLER ULTRASOUND TECHNIQUE: Gray-scale sonography with compression, as well as color and duplex ultrasound, were performed to evaluate the deep venous system(s) from the level of the common femoral vein  through the popliteal and proximal calf veins. COMPARISON:  10/13/2019 FINDINGS: VENOUS Normal compressibility of the common femoral, superficial femoral, and popliteal veins, as well as the visualized calf veins. Visualized portions of profunda femoral vein and great saphenous vein unremarkable. No filling defects to suggest DVT on grayscale or color Doppler imaging. Doppler waveforms show normal direction of venous flow, normal respiratory plasticity and response to augmentation. Limited views of the contralateral common femoral vein are unremarkable. OTHER None. Limitations: none IMPRESSION: 1. No evidence of deep venous thrombosis within the left lower extremity. Electronically Signed   By: Randa Ngo M.D.   On: 04/12/2021 18:17    Procedures Procedures    Medications Ordered in ED Medications  iohexol (OMNIPAQUE) 350 MG/ML injection 100 mL (100 mLs Intravenous Contrast Given 04/12/21 1821)  sodium chloride 0.9 % bolus 1,000 mL (0 mLs Intravenous Stopped 04/12/21 2012)    ED Course/ Medical Decision Making/ A&P                           Medical Decision Making  Blace Lepre Rebar is a 46 y.o. male with a past medical history significant for previous DVT/PE status post IVC filter and on aspirin, hypertension, hyperlipidemia, GERD, May Thurner syndrome, hypercholesterolemia, and obesity who presents with 4 days of fatigue, nausea, vomiting, diarrhea, urinary frequency, left calf pain, and chest pain with shortness of breath.  According to patient, his symptoms in the chest and leg were similar to when he had thromboembolic disease last year.  He reports he got diaphoretic had chest pain and shortness of breath worsened today and presented for evaluation.  He says that he has had the other day symptoms with the diarrhea, nausea, vomiting, and urinary frequency for a few days.  Is unsure of any sick contacts but works at [employer] and is in contact with many people daily.  Denies any  trauma.  Denies any cough or congestion.  Denies actual fevers or chills.  On exam, lungs are clear and chest is tender to palpation.  Abdomen is nontender.  Normal bowel sounds.  Good pulses in all extremities.  Patient's left leg has normal sensation, strength, and pulses both DP and PT arteries.  Patient has some tenderness in his left calf with a small area of bruising medially.  Hip nontender.  Flanks and back nontender.  Abdomen nontender.  Good pulses in upper extremities that are symmetric.  No focal neurologic deficits.  EKG did not show STEMI.  Given the patient's history of previous thromboembolic disease with PE and elevated troponin,  and his similar to prior symptoms, we will get ultrasound and CT of the chest.  X-ray was obtained in triage which did not show any acute abnormality.  Due to the urinary symptoms we will check urinalysis and with the other vague symptoms we will get screening labs.  Patient's work-up began to return.  Ultrasound was negative for DVT.  CT PE study was negative for large PE.  No pneumonia seen.  Labs show some mild hyponatremia and evidence of AKI.  Suspect dehydration and I am also suspicious about a viral infection that has caused his constellation of symptoms that may have caused recrudescence of previous pain in the setting of body inflammation.  We will get the urinalysis and otherwise.  His COVID and flu are negative.  Will get L troponin, give fluids, and check the urine.  If work-up is reassuring and he is feeling better, anticipate discharge home to follow-up with PCP.    Work-up returned overall reassuring.  No evidence of DVT or PE on imaging.  COVID and flu negative.  Labs showed AKI which we discussed and suspect related to dehydration.  No evidence of UTI.  After fluids heart rate has improved and he is feeling much better.  Suspect dehydration and viral infection leading to his constellation of symptoms.  Given the lack of thromboembolic disease or  other concerning findings, patient would like to go home.  Delta troponin negative.  Will follow-up with his PCP and understood return precautions.  He no other questions or concerns and patient was discharged in good condition.        Final Clinical Impression(s) / ED Diagnoses Final diagnoses:  Dehydration  Chest pain, unspecified type  AKI (acute kidney injury) ([place])    Rx / DC Orders ED Discharge Orders     None      Clinical Impression: 1. Dehydration   2. Chest pain, unspecified type   3. AKI (acute kidney injury) ([place])     Disposition: Discharge  Condition: Good  I have discussed the results, Dx and Tx plan with the pt(& family if present). He/she/they expressed understanding and agree(s) with the plan. Discharge instructions discussed at great length. Strict return precautions discussed and pt &/or family have verbalized understanding of the instructions. No further questions at time of discharge.    New Prescriptions   No medications on file    Follow Up: Wenda Low, MD [address] [address] [place] EMERGENCY DEPARTMENT [address] [id number] [place] [phone]        Bernisha Verma, Gwenyth Allegra, MD 04/12/21 2112

## 2021-04-14 LAB — URINE CULTURE: Culture: NO GROWTH

## 2021-04-29 ENCOUNTER — Other Ambulatory Visit: Payer: Self-pay

## 2021-04-29 ENCOUNTER — Ambulatory Visit (HOSPITAL_COMMUNITY)
Admission: RE | Admit: 2021-04-29 | Discharge: 2021-04-29 | Disposition: A | Discharge status: Home / Self Care | Payer: Managed Care, Other (non HMO) | Source: Ambulatory Visit | Attending: Vascular Surgery | Admitting: Vascular Surgery

## 2021-04-29 ENCOUNTER — Ambulatory Visit (INDEPENDENT_AMBULATORY_CARE_PROVIDER_SITE_OTHER)
Admission: RE | Admit: 2021-04-29 | Discharge: 2021-04-29 | Disposition: A | Discharge status: Home / Self Care | Payer: Managed Care, Other (non HMO) | Source: Ambulatory Visit | Attending: Vascular Surgery | Admitting: Vascular Surgery

## 2021-04-29 ENCOUNTER — Ambulatory Visit: Payer: Managed Care, Other (non HMO) | Admitting: Physician Assistant

## 2021-04-29 VITALS — BP 140/94 | HR 65 | Temp 98.7°F | Resp 20 | Ht 72.0 in | Wt 272.4 lb

## 2021-04-29 DIAGNOSIS — I82442 Acute embolism and thrombosis of left tibial vein: Secondary | ICD-10-CM

## 2021-04-29 DIAGNOSIS — I871 Compression of vein: Secondary | ICD-10-CM | POA: Diagnosis not present

## 2021-04-29 DIAGNOSIS — F341 Dysthymic disorder: Secondary | ICD-10-CM | POA: Insufficient documentation

## 2021-04-29 NOTE — Progress Notes (Signed)
Office Note     CC:  follow up Requesting Provider:  Georgann HousekeeperHusain, Karrar, Stevens  HPI: Christian Stevens is a 46 y.o. (27-Feb-1976) male who presents for evaluation of worsening swelling and discomfort in left leg over the past several weeks. He is s/p mechanical thrombectomy of his left popliteal, femoral, common femoral, external and common iliac veins and IVC with stenting of the left common and external iliac vein by Dr. Randie Heinzain on 10/16/19. This was for extensive DVT and May Thurner syndrome.  He was on Xarelto, but this was discontinued at instruction of Hem/onc and instructed to take daily asa.  He has been compliant with his aspirin. He says that he feels like the left leg continues to have swelling despite elevation and compression, which he notes use to improve this. He has been walking more recently because he has been trying to lose weight. Otherwise he says his activity is unchanged. He wears knee high compression stockings daily and elevates ever day and night. His leg has been feeling very sore and tender to the touch "like a deep bruise".  He also reports that he has been experiencing some chest tightness and pain across his right shoulder. He was just seen in ER for this a couple weeks ago. He is wondering if this is related to his leg. He had DVT ultrasound as well as CT that showed no DVT or PE. Suspected is symptoms were related to dehydration because he improved after fluid administration.   Past Medical History:  Diagnosis Date   Essential hypertension 10/14/2019   GERD without esophagitis 10/14/2019   High cholesterol    Hypertension    Mixed hyperlipidemia 10/14/2019    Past Surgical History:  Procedure Laterality Date   INTRAVASCULAR ULTRASOUND/IVUS Left 10/16/2019   Procedure: Intravascular Ultrasound/IVUS;  Surgeon: Maeola Harmanain, Brandon Dmetrius, Stevens;  [facility];  Service: Cardiovascular;  Laterality: Left;  lower leg IVC   IVC VENOGRAPHY N/A 10/16/2019    Procedure: IVC Venography;  Surgeon: Maeola Harmanain, Brandon Abas, Stevens;  [facility];  Service: Cardiovascular;  Laterality: N/A;   PERIPHERAL VASCULAR ATHERECTOMY Left 10/16/2019   Procedure: PERIPHERAL VASCULAR ATHERECTOMY;  Surgeon: Maeola Harmanain, Brandon Michelangelo, Stevens;  [facility];  Service: Cardiovascular;  Laterality: Left;  common/ext iliac /common fem popliteal   PERIPHERAL VASCULAR INTERVENTION Left 10/16/2019   Procedure: PERIPHERAL VASCULAR INTERVENTION;  Surgeon: Maeola Harmanain, Brandon Kashton, Stevens;  [facility];  Service: Cardiovascular;  Laterality: Left;  COMMON/EXT ILIAC    Social History   Socioeconomic History   Marital status: Single    Spouse name: Not on file   Number of children: Not on file   Years of education: Not on file   Highest education level: Not on file  Occupational History   Not on file  Tobacco Use   Smoking status: Never   Smokeless tobacco: Never  Vaping Use   Vaping Use: Never used  Substance and Sexual Activity   Alcohol use: No   Drug use: No   Sexual activity: Not on file  Other Topics Concern   Not on file  Social History Narrative   Not on file   Social Determinants of Health   Financial Resource Strain: Not on file  Food Insecurity: Not on file  Transportation Needs: Not on file  Physical Activity: Not on file  Stress: Not on file  Social Connections: Not on file  Intimate Partner Violence: Not on file  Family History  Problem Relation Age of Onset   Hypertension Father    Hyperlipidemia Father     Current Outpatient Medications  Medication Sig Dispense Refill   amLODipine (NORVASC) 5 MG tablet Take 5 mg by mouth daily.     aspirin EC 81 MG tablet Take 81 mg by mouth daily. Swallow whole.     escitalopram (LEXAPRO) 10 MG tablet Take 10 mg by mouth daily.     losartan (COZAAR) 100 MG tablet Take 100 mg by mouth daily.     omeprazole (PRILOSEC OTC) 20 MG tablet Take 20 mg by mouth daily.      OVER THE COUNTER MEDICATION Take 1 tablet by mouth daily. Calcium supplement     rosuvastatin (CRESTOR) 20 MG tablet Take 20 mg by mouth at bedtime.     No current facility-administered medications for this visit.    Allergies  Allergen Reactions   Hydrochlorothiazide     Other reaction(s): hyponatremia   Lisinopril Cough   Simvastatin     Other reaction(s): Unknown     REVIEW OF SYSTEMS:  [X]  denotes positive finding, [ ]  denotes negative finding Cardiac  Comments:  Chest pain or chest pressure:    Shortness of breath upon exertion:    Short of breath when lying flat:    Irregular heart rhythm:        Vascular    Pain in calf, thigh, or hip brought on by ambulation:    Pain in feet at night that wakes you up from your sleep:     Blood clot in your veins:    Leg swelling:  X       Pulmonary    Oxygen at home:    Productive cough:     Wheezing:         Neurologic    Sudden weakness in arms or legs:     Sudden numbness in arms or legs:     Sudden onset of difficulty speaking or slurred speech:    Temporary loss of vision in one eye:     Problems with dizziness:         Gastrointestinal    Blood in stool:     Vomited blood:         Genitourinary    Burning when urinating:     Blood in urine:        Psychiatric    Major depression:         Hematologic    Bleeding problems:    Problems with blood clotting too easily:        Skin    Rashes or ulcers:        Constitutional    Fever or chills:      PHYSICAL EXAMINATION:  Vitals:   04/29/21 1033  BP: (!) 140/94  Pulse: 65  Resp: 20  Temp: 98.7 F (37.1 C)  TempSrc: Temporal  SpO2: 98%  Weight: 272 lb 6.4 oz (123.6 kg)  Height: 6' (1.829 m)    General:  WDWN in NAD; vital signs documented above Gait: Normal HENT: WNL, normocephalic Pulmonary: normal non-labored breathing , without wheezing Cardiac: regular HR, without  Murmurs  Extremities: without ischemic changes, without Gangrene ,  without cellulitis; without open wounds; 2+ pulses bilaterally. Bilateral edema present left leg > right  Musculoskeletal: no muscle wasting or atrophy  Neurologic: A&O X 3;  No focal weakness or paresthesias are detected Psychiatric:  The pt has Normal affect.   Non-Invasive  Vascular Imaging:    Left lower extremity DVT duplex today shows no evidence of LLE DVT or SVT. No evidence of DVT in right CFV. Does note limited visualization of the proximal and mid segments of calf veins with some pulsatility.   VAS [place] IVC/Iliac duplex shows no IVC or iliac vein DVT. Patent left iliac vein stent.    ASSESSMENT/PLAN:: 46 y.o. male here for follow up for increased left leg swelling and discomfort. Patient has history of LLE DVT with May Thurner. He is s/p mechanical thrombectomy of his left popliteal, femoral, common femoral, external and common iliac veins and IVC with stenting of the left common and external iliac vein by Dr. Randie Heinz on 10/16/19. Duplex today shows patent left iliac vein stent. No acute DVT throughout left lower extremity.  - Encouraged patient to continue exercise regimen, daily compression and elevation - continue Aspirin 81 mg Daily - Discussed evaluation for venous insufficiency but explained to patient that it likely would not change management. We will arrange for this to be evaluated at his next office visit - He will follow up in 6 months with IVC duplex and LLE reflux duplex   Graceann Congress, PA-C [facility] [phone]  Clinic [place]:   [place]/ Randie Heinz

## 2021-05-04 ENCOUNTER — Other Ambulatory Visit: Payer: Self-pay | Admitting: *Deleted

## 2021-05-04 DIAGNOSIS — I871 Compression of vein: Secondary | ICD-10-CM

## 2021-05-04 DIAGNOSIS — I82442 Acute embolism and thrombosis of left tibial vein: Secondary | ICD-10-CM

## 2021-07-12 IMAGING — CT CT ANGIO CHEST
2 of 6 series · 18 of 36 positions shown · IV contrast (omnipaque)
Comparison: Suboptimal exam of 1 day prior

CLINICAL DATA: Clinical deterioration in patient with known deep
venous thrombosis. Suspicion of pulmonary embolism.

EXAM:
CT ANGIOGRAPHY CHEST WITH CONTRAST
TECHNIQUE: Multidetector CT imaging of the chest was performed using the
standard protocol during bolus administration of intravenous
contrast. Multiplanar CT image reconstructions and MIPs were
obtained to evaluate the vascular anatomy.
CONTRAST:  100mL OMNIPAQUE IOHEXOL 350 MG/ML SOLN

[Series 7: pe thins · axial · 0.76mm/px · z∈[-338,-68]mm · 17 of 204 slices shown]
[im 12/204  lung]
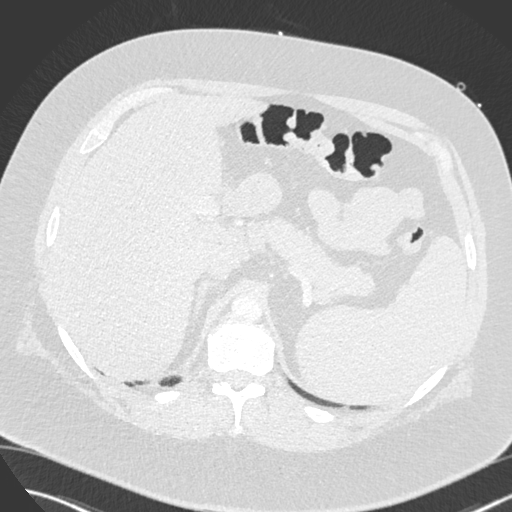
[im 23/204  mediastinal]
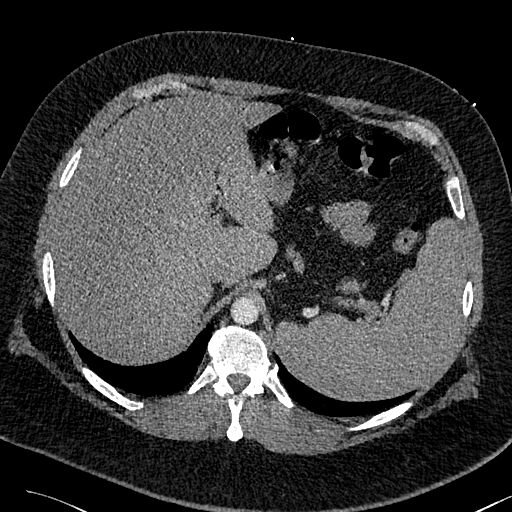
[im 34/204  lung]
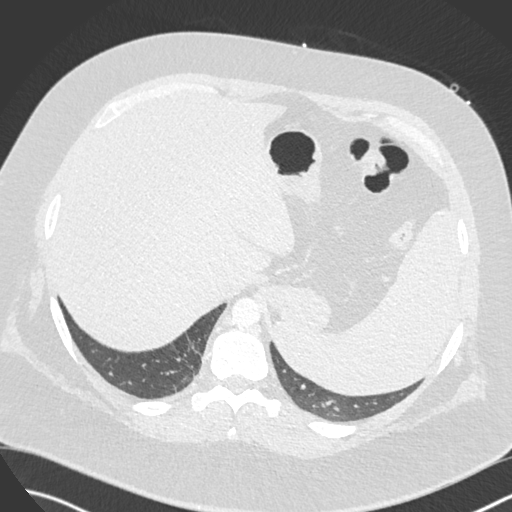
[im 46/204  mediastinal]
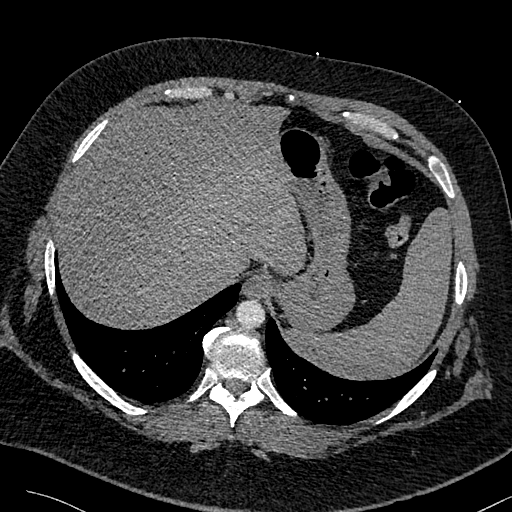
[im 57/204  lung]
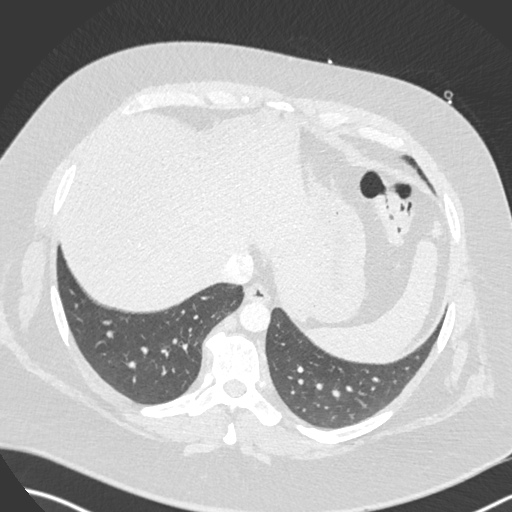
[im 68/204  mediastinal]
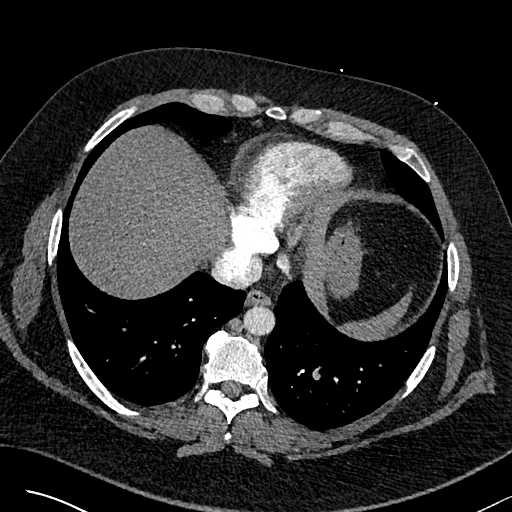
[im 79/204  lung]
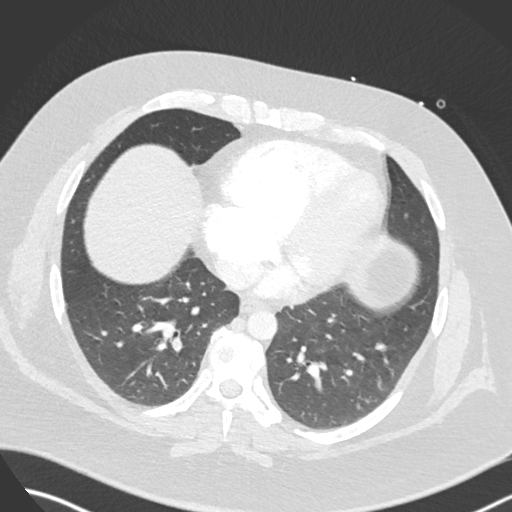
[im 91/204  mediastinal]
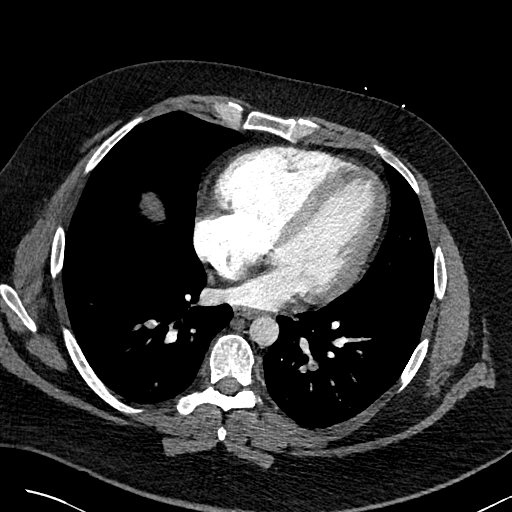
[im 102/204  lung]
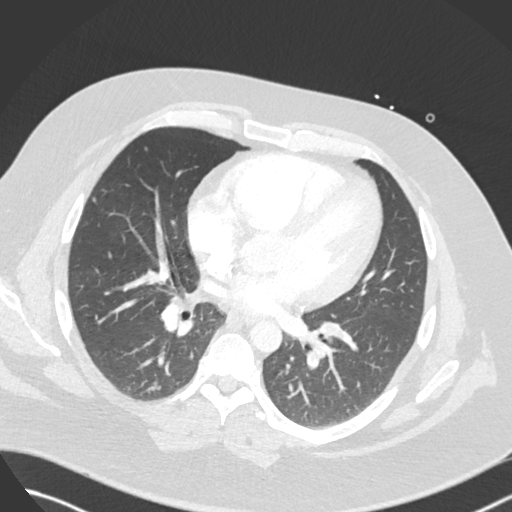
[im 113/204  mediastinal]
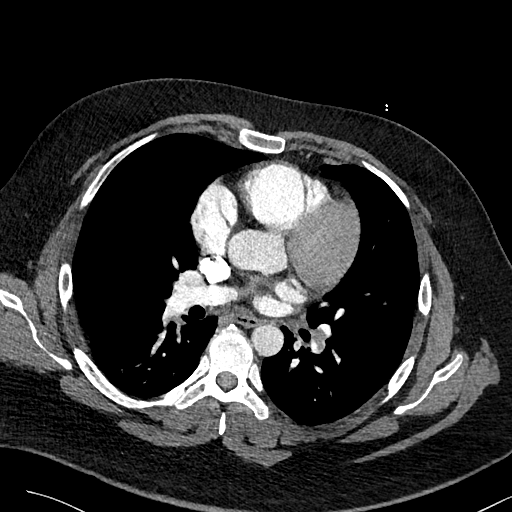
[im 125/204  lung]
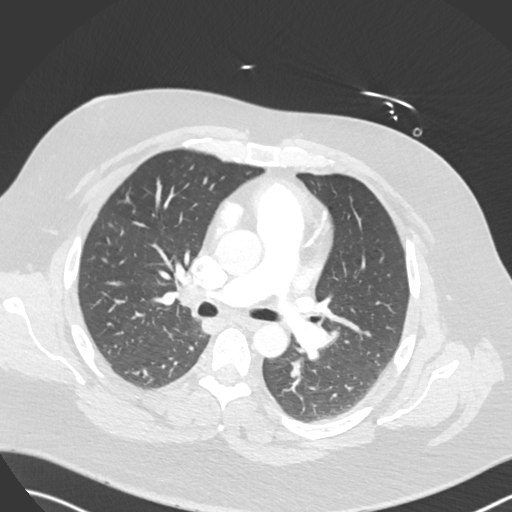
[im 136/204  mediastinal]
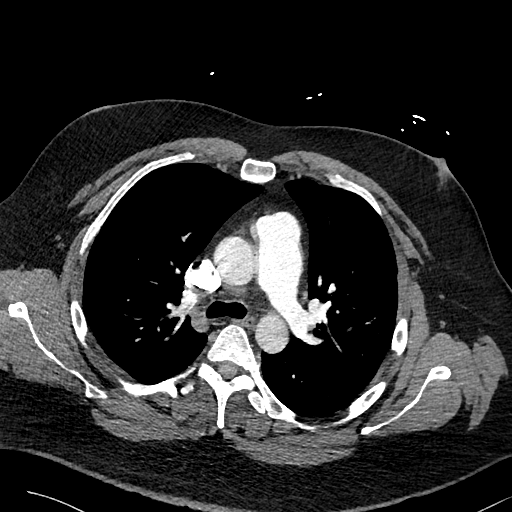
[im 147/204  lung]
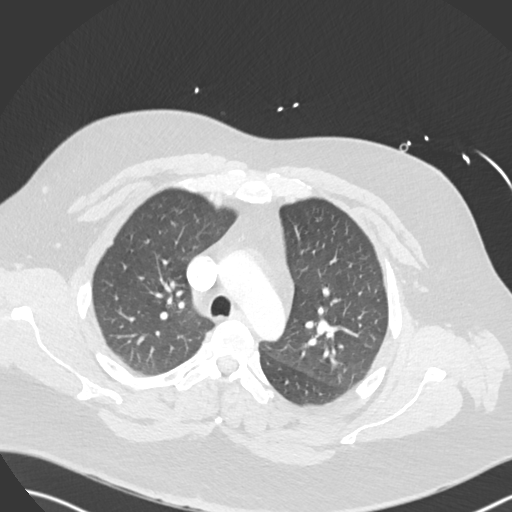
[im 158/204  mediastinal]
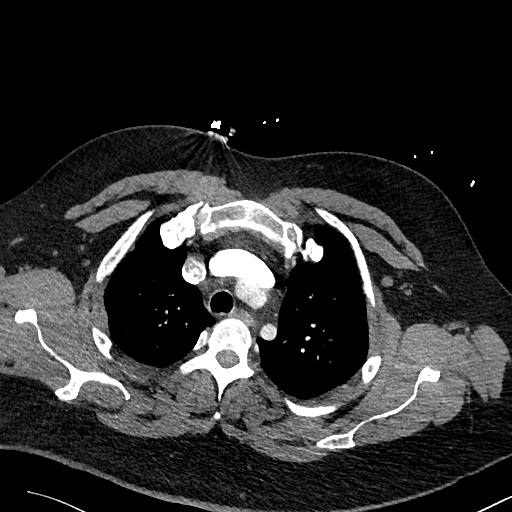
[im 170/204  lung]
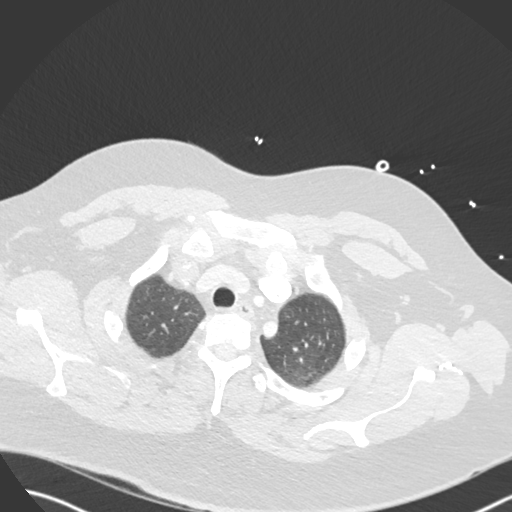
[im 181/204  mediastinal]
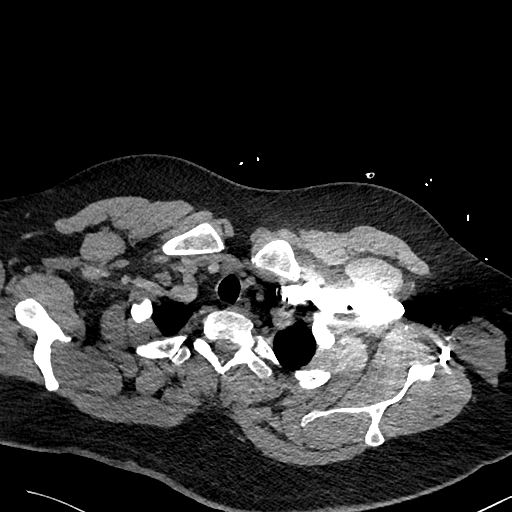
[im 192/204  lung]
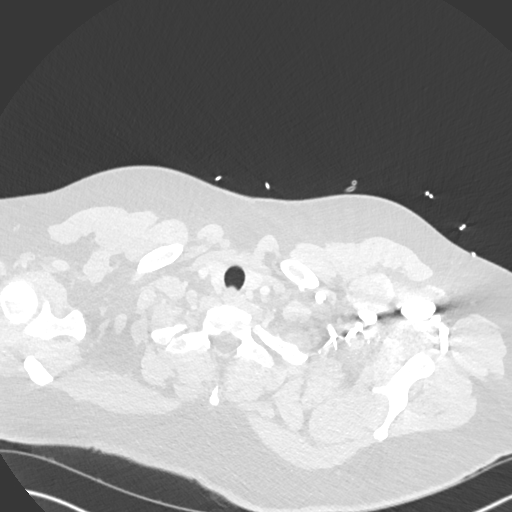

[Series 8: pe 2mm cor · coronal · 0.60mm/px · 1 of 106 slices shown]
[im 53/106  mediastinal]
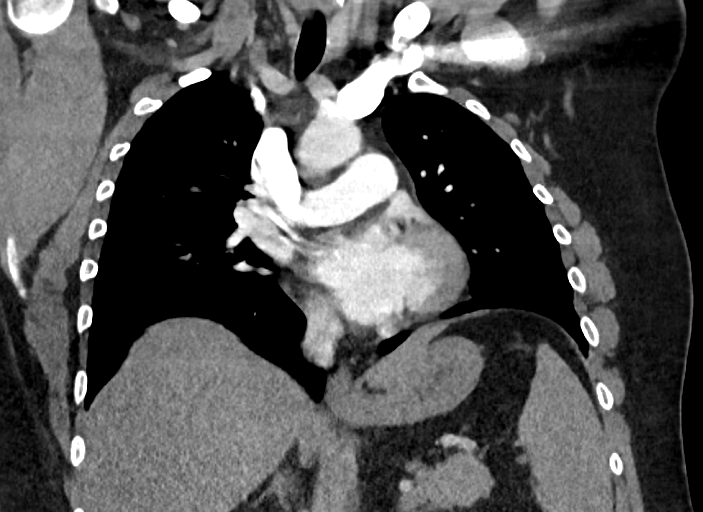

[18 of 36 positions shown; findings below may reference images not displayed]

FINDINGS: Cardiovascular: The quality of this exam for evaluation of pulmonary
embolism is moderate, but sufficient. Left lower lobe lobar and
segmental pulmonary emboli identified. Segmental to subsegmental
right-sided emboli.

The RV-LV ratio is 5.8-3.7 cm=1.6.

Bovine arch.  Mild cardiomegaly.

Mediastinum/Nodes: No mediastinal or hilar adenopathy.

Lungs/Pleura: No pleural fluid. 2 mm right upper lobe pulmonary
nodules x2 on 52/6.

Upper Abdomen: Hepatic steatosis. Normal imaged portions of the
spleen, stomach, pancreas, gallbladder.

Musculoskeletal: No acute osseous abnormality.

Review of the MIP images confirms the above findings.
IMPRESSION: 1. Left larger than right, moderate volume pulmonary emboli.
Positive for acute PE with CT evidence of right heart strain (RV/LV
Ratio = 1.6) consistent with at least submassive (intermediate risk)
PE. The presence of right heart strain has been associated with an
increased risk of morbidity and mortality. Please refer to the "PE
Focused" order set in [REDACTED].
2. Tiny right upper lobe pulmonary nodules. No follow-up needed if
patient is low-risk. Non-contrast chest CT can be considered in 12
months if patient is high-risk. This recommendation follows the
consensus statement: Guidelines for Management of Incidental
Pulmonary Nodules Detected on CT Images: From the [HOSPITAL]
3. Hepatic steatosis.

These results will be called to the ordering clinician or
representative by the Radiologist Assistant, and communication
documented in the PACS or [REDACTED].

## 2023-04-13 ENCOUNTER — Ambulatory Visit: Payer: Managed Care, Other (non HMO) | Admitting: Internal Medicine

## 2023-04-13 ENCOUNTER — Encounter: Payer: Self-pay | Admitting: Internal Medicine

## 2023-04-13 VITALS — BP 177/110 | HR 84 | Temp 98.4°F | Ht 72.0 in | Wt 338.2 lb

## 2023-04-13 DIAGNOSIS — Z1211 Encounter for screening for malignant neoplasm of colon: Secondary | ICD-10-CM

## 2023-04-13 DIAGNOSIS — E7800 Pure hypercholesterolemia, unspecified: Secondary | ICD-10-CM

## 2023-04-13 DIAGNOSIS — K219 Gastro-esophageal reflux disease without esophagitis: Secondary | ICD-10-CM

## 2023-04-13 DIAGNOSIS — I10 Essential (primary) hypertension: Secondary | ICD-10-CM | POA: Diagnosis not present

## 2023-04-13 DIAGNOSIS — Z119 Encounter for screening for infectious and parasitic diseases, unspecified: Secondary | ICD-10-CM

## 2023-04-13 DIAGNOSIS — E782 Mixed hyperlipidemia: Secondary | ICD-10-CM

## 2023-04-13 DIAGNOSIS — Z0001 Encounter for general adult medical examination with abnormal findings: Secondary | ICD-10-CM | POA: Diagnosis not present

## 2023-04-13 DIAGNOSIS — Z0000 Encounter for general adult medical examination without abnormal findings: Secondary | ICD-10-CM

## 2023-04-13 DIAGNOSIS — E785 Hyperlipidemia, unspecified: Secondary | ICD-10-CM

## 2023-04-13 DIAGNOSIS — Z8042 Family history of malignant neoplasm of prostate: Secondary | ICD-10-CM

## 2023-04-13 DIAGNOSIS — R0683 Snoring: Secondary | ICD-10-CM

## 2023-04-13 DIAGNOSIS — Z23 Encounter for immunization: Secondary | ICD-10-CM | POA: Diagnosis not present

## 2023-04-13 DIAGNOSIS — Z6841 Body Mass Index (BMI) 40.0 and over, adult: Secondary | ICD-10-CM

## 2023-04-13 DIAGNOSIS — R7989 Other specified abnormal findings of blood chemistry: Secondary | ICD-10-CM

## 2023-04-13 DIAGNOSIS — F341 Dysthymic disorder: Secondary | ICD-10-CM

## 2023-04-13 DIAGNOSIS — R7303 Prediabetes: Secondary | ICD-10-CM

## 2023-04-13 DIAGNOSIS — Z86711 Personal history of pulmonary embolism: Secondary | ICD-10-CM | POA: Insufficient documentation

## 2023-04-13 DIAGNOSIS — D72828 Other elevated white blood cell count: Secondary | ICD-10-CM

## 2023-04-13 DIAGNOSIS — I871 Compression of vein: Secondary | ICD-10-CM

## 2023-04-13 DIAGNOSIS — Z113 Encounter for screening for infections with a predominantly sexual mode of transmission: Secondary | ICD-10-CM

## 2023-04-13 MED ORDER — ESCITALOPRAM OXALATE 10 MG PO TABS: 10.0000 mg | ORAL_TABLET | Freq: Every day | ORAL | 3 refills | Status: AC

## 2023-04-13 MED ORDER — LOSARTAN POTASSIUM 100 MG PO TABS: 100.0000 mg | ORAL_TABLET | Freq: Every day | ORAL | 3 refills | Status: AC

## 2023-04-13 MED ORDER — ROSUVASTATIN CALCIUM 20 MG PO TABS: 20.0000 mg | ORAL_TABLET | Freq: Every day | ORAL | 3 refills | Status: AC

## 2023-04-13 MED ORDER — AMLODIPINE BESYLATE 5 MG PO TABS: 5.0000 mg | ORAL_TABLET | Freq: Every day | ORAL | 3 refills | Status: DC

## 2023-04-13 MED ORDER — TIRZEPATIDE-WEIGHT MANAGEMENT 2.5 MG/0.5ML ~~LOC~~ SOLN: 2.5000 mg | SUBCUTANEOUS | 11 refills | Status: DC

## 2023-04-13 MED ORDER — OMEPRAZOLE MAGNESIUM 20 MG PO TBEC: 20.0000 mg | DELAYED_RELEASE_TABLET | Freq: Every day | ORAL | 3 refills | Status: AC

## 2023-04-13 NOTE — Progress Notes (Signed)
[facility]: [id number]   -- Annual Preventive Medical Office Visit --  Patient:  Christian Stevens      Age: 48 y.o.       Sex:  male  Date:   04/13/2023 Patient Care Team: Lula Olszewski, MD as PCP - General (Internal Medicine) Today's Healthcare Provider: Lula Olszewski, MD  ========================================= Chief Complaint  Patient presents with   New Patient (Initial Visit)        Hypertension    Purpose of Visit: Comprehensive preventive health assessment and personalized health maintenance planning.  This encounter was conducted as a Comprehensive Physical Exam (CPE) preventive care annual visit. The patient's medical history and problem list were reviewed to inform individualized preventive care recommendations.  In addition to the usual Comprehensive Physical Exam (CPE) preventive care annual visit, we addressed all his chronic medical problems today.    Assessment & Plan Encounter for annual general medical examination with abnormal findings in adult  Need for influenza vaccination  Screen for colon cancer  Screen for sexually transmitted diseases  Screening examination for infectious disease  Primary hypertension Hypertension Chronic hypertension is managed with amlodipine and losartan. The impact of untreated sleep apnea on blood pressure and the benefits of CPAP therapy were discussed. Weight loss medications like Zepbound were addressed for managing weight and sleep apnea. Restart losartan and continue amlodipine. Order a sleep study and prescribe Zepbound for weight loss. Follow up in 2-3 months for a blood pressure check. Morbid obesity (HCC)  Other elevated white blood cell count  Prediabetes Prediabetes Prediabetes management focuses on weight management and the benefits of weight loss medications to reduce diabetes risk. Order a full lab panel including diabetes screening and prescribe Zepbound for  weight loss. Pure hypercholesterolemia  Mixed hyperlipidemia Hyperlipidemia Chronic hyperlipidemia is managed with rosuvastatin. Continue rosuvastatin. May-Thurner syndrome May-Thurner Syndrome with History of Pulmonary Embolism Left iliac vein compression is managed with baby aspirin. Monitoring for recurrence and the benefits of CPAP therapy in reducing clot risk were discussed. Continue baby aspirin and monitor for symptoms of recurrence. Recommend CPAP therapy to reduce clot risk. Essential hypertension  GERD without esophagitis Gastroesophageal Reflux Disease (GERD) Chronic GERD is managed with Prilosec. Potential long-term side effects, including vitamin deficiencies, were discussed. Continue Prilosec and monitor for vitamin deficiencies. Elevated troponin level not due myocardial infarction  Dysthymia Dysthymia Chronic low-grade depression is managed with Lexapro. Therapy and the potential benefits of CPAP therapy and weight loss on mood were discussed. Continue Lexapro and consider therapy if needed. Recommend CPAP therapy for mood improvement. Dyslipidemia  Body mass index (BMI) 40.0-44.9, adult (HCC)  History of pulmonary embolism  Snoring  FH: prostate cancer  Encounter for annual health examination  Preventative health care General Health Maintenance Annual physical exam and health maintenance were discussed, including regular screenings and vaccinations. Recommend Cologuard for colon cancer screening and a PSA test due to family history of prostate cancer. Order a full lab panel and Cologuard for colon cancer screening. Administer a flu shot and recommend dental and eye exams. Recommend exercise and a healthy diet. Schedule an annual physical for next year and consider a PSA test due to family history of prostate cancer.  Follow-up Schedule a follow-up in 2-3 months for a blood pressure check and an annual physical for next year. Healthcare maintenance  Diagnoses  and all orders for this visit: Encounter for annual general medical examination with abnormal findings in adult Need for influenza  vaccination -     Flu vaccine trivalent PF, 6mos and older(Flulaval,Afluria,Fluarix,Fluzone) Screen for colon cancer -     Cologuard Screen for sexually transmitted diseases -     RPR -     HSV(herpes simplex vrs) 1+2 ab-IgG -     Urine cytology ancillary only() Screening examination for infectious disease -     HIV antibody (with reflex) -     Hepatitis C Antibody Primary hypertension -     amLODipine (NORVASC) 5 MG tablet; Take 1 tablet (5 mg total) by mouth daily. -     losartan (COZAAR) 100 MG tablet; Take 1 tablet (100 mg total) by mouth daily. Morbid obesity (HCC) -     Lipid panel -     Comprehensive metabolic panel -     CBC with Differential/Platelet -     TSH Rfx on Abnormal to Free T4 -     HgB A1c Other elevated white blood cell count Prediabetes -     Lipid panel -     Comprehensive metabolic panel -     CBC with Differential/Platelet -     TSH Rfx on Abnormal to Free T4 -     HgB A1c Pure hypercholesterolemia Mixed hyperlipidemia -     rosuvastatin (CRESTOR) 20 MG tablet; Take 1 tablet (20 mg total) by mouth at bedtime. -     Lipid panel -     Comprehensive metabolic panel -     CBC with Differential/Platelet -     TSH Rfx on Abnormal to Free T4 -     HgB A1c May-Thurner syndrome Essential hypertension GERD without esophagitis -     omeprazole (PRILOSEC OTC) 20 MG tablet; Take 1 tablet (20 mg total) by mouth daily. Elevated troponin level not due myocardial infarction Dysthymia -     escitalopram (LEXAPRO) 10 MG tablet; Take 1 tablet (10 mg total) by mouth daily. Dyslipidemia Body mass index (BMI) 40.0-44.9, adult (HCC) -     tirzepatide (ZEPBOUND) 2.5 MG/0.5ML injection vial; Inject 2.5 mg into the skin once a week. History of pulmonary embolism Snoring -     Ambulatory referral to Sleep Studies FH: prostate  cancer Encounter for annual health examination Preventative health care Healthcare maintenance   Today's Health Maintenance Counseling and Anticipatory Guidance:  Eye exams:  We encouraged patient to to complete eye exam every 1-2 years.  1 Dental health:  He reports he has been keeping up with his dental visits.  He intends to do so in the future.  We encourage regular tooth brushing/flossing. Sinus health:  We encouraged sterile saline nasal misting sinus rinses daily for pollen, to reduce allergies and risk for sinus infections.   Sterile can based misting products are recommended due to superior misting and ease of maintaining sterility Sleep Apnea screening:  We encourage self monitoring of sleep quality with [employer] App and other tools/apps that are now available at retail Cardiovascular Risk Factor Reduction:   Advised patient of need for regular exercise and diet rich and fruits and vegetables and healthy fats to reduce risk of heart attack and stroke.  Avoid first- and second-hand smoke and stimulants.   Avoid extreme exercise- exercise in moderation (150 minutes per week is a good goal) Discussed health benefits of physical activity, and encouraged him to engage in regular exercise appropriate for his age and condition. Exercise Activities and Dietary recommendations  Goals   None    Wt Readings from Last 3 Encounters:  04/13/23 (!) 338 lb 3.2 oz (153.4 kg)  04/29/21 272 lb 6.4 oz (123.6 kg)  04/12/21 264 lb (119.7 kg)  Body mass index is 45.87 kg/m. weight gain noted and addressed with medication(s)  Health maintenance and immunizations reviewed and he was encouraged to complete anything that is due: Immunization History  Administered Date(s) Administered   Influenza Split 01/26/2013, 01/10/2014, 11/28/2017, 01/10/2019   Influenza, Seasonal, Injecte, Preservative Fre 04/13/2023   Influenza,inj,Quad PF,6+ Mos 02/17/2012, 01/16/2016, 01/17/2020   Influenza,inj,quad, With  Preservative 01/13/2015   Janssen (J&J) SARS-COV-2 Vaccination 07/04/2019   Tdap 07/12/2013, 09/06/2017   Health Maintenance Due  Topic Date Due   Colonoscopy  Never done    Health Maintenance  Topic Date Due   Colonoscopy  Never done   DTaP/Tdap/Td (3 - Td or Tdap) 09/07/2027   INFLUENZA VACCINE  Completed   Hepatitis C Screening  Completed   HIV Screening  Completed   HPV VACCINES  Aged Out   COVID-19 Vaccine  Discontinued    Lifestyle / Social History Reviewed:  Social History   Tobacco Use   Smoking status: Never   Smokeless tobacco: Never  Vaping Use   Vaping status: Never Used  Substance Use Topics   Alcohol use: Yes   Drug use: No    My recommendation is total abstinence from all substances of abuse including smoke and 2nd hand smoke, alcohol, illicit drugs, smoking, inhalants, sugar, high risk sexual behavior Offered to assist with any use disorders or addictions.   Sexual transmitted infection testing offered today, but patient declined as he feels he is low risk based on his sexual history.   Social History   Substance and Sexual Activity  Sexual Activity Yes   Birth control/protection: Condom  .      04/13/2023    3:13 PM  Depression screen PHQ 2/9  Decreased Interest 0  Down, Depressed, Hopeless 1  PHQ - 2 Score 1  Altered sleeping 0  Tired, decreased energy 0  Change in appetite 0  Feeling bad or failure about yourself  0  Trouble concentrating 0  Moving slowly or fidgety/restless 0  Suicidal thoughts 0  PHQ-9 Score 1  Difficult doing work/chores Somewhat difficult    Today's Cancer Screening  Penile/Testicle/Scrotum cancer screening: Asked the patient to self-monitor for genital tumors. Patient reports there are none. Thyroid cancer screening: Advised to check by palpating thyroid for nodules Prostate cancer screening:  Denies family history of prostate cancer or hematospermia so too young for screening by current guidelines.(No results found for:  "PSA") Colon cancer screening:  will do Cologuard.  Lung cancer screening:  Current guidelines recommend Individuals aged 41 to 75 who currently smoke or formerly smoked and have a >= 20 pack-year smoking history should undergo annual screening with low-dose computed tomography (LDCT). Skin cancer screening: Advised regular sunscreen use. He denies worrisome, changing, or new skin lesions. Showed him pictures of melanomas for reference:        Subjective  48 y.o. male presents today for a complete physical exam.  History of Present Illness The patient, with a history of cholesterol, prediabetes, high white blood cell count, GERD, and May-Thurner syndrome, presents with concerns related to their mental health and physical well-being. They report experiencing intermittent episodes of feeling down and anxious, with some days being better than others. The patient describes these episodes as being triggered by minor incidents, leading to feelings of wanting to break down.  The patient also has a history of  a blood clot in the lung, which occurred while they were working. They recall feeling unwell one day, and upon waking the next morning, they noticed significant swelling in one leg. They sought medical attention and were sent to the emergency room, where the blood clot dislodged and traveled to their lungs. The patient acknowledges the severity of the situation, expressing gratitude for surviving the incident.  The patient also reports issues with their sleep, stating that they can only sleep comfortably on their side. They express difficulty breathing when sleeping on their back or stomach. They have not been formally tested for sleep apnea but acknowledge that it is a possibility.  The patient also mentions a recent personal incident where their partner cheated on them, which has caused emotional distress. Despite this, they deny feeling severely depressed. They have been seeing a therapist for a  couple of years and are currently on Lexapro.  The patient also reports issues with their eating habits, particularly eating at night. They note that they do not eat much during the day but tend to eat more once they get home from work. They express interest in a weight loss drug and are open to trying it.  The patient has a history of taking various medications, including Lexapro, and has been advised to restart some of these at half doses. They have been off blood thinners for about two years and are currently on a baby aspirin regimen. They have no known family history of blood clots. They also have a family history of prostate cancer from their father's side.  Past Medical History - Cholesterol - Prediabetes - History of high white count - GERD - May-Thurner syndrome - Blood clot in lungs - Sleep apnea  ROSA comprehensive ROS was negative for any concerning symptoms.  Disclaimer about ROS at Annual Preventive Visits Patients are informed before the Review of Systems (ROS) that identifying significant medical issues during the wellness visit may require immediate attention, potentially resulting in a separate billable encounter beyond the scope of the preventive exam. This disclosure is mandated by professional ethics and legal obligations, as healthcare providers must address any substantial health concerns raised during any patient interaction.  A comprehensive ROS is required by insurance companies for billing the visit. However, this structure may inadvertently discourage patients from fully disclosing health concerns due to potential financial implications. Consequently, patients often emphasize that any positive ROS findings are related to stable chronic conditions, requesting that these not be discussed during the preventive visit to avoid additional charges. Patients may also ask that reported complaints not be listed in the ROS to prevent affecting billing.  Problem list overviews that  were updated at today's visit: No problems updated.   I attest that I have reviewed and confirmed the patients current medications to meet the medication reconciliation requirement  Current Outpatient Medications on File Prior to Visit  Medication Sig   aspirin EC 81 MG tablet Take 81 mg by mouth daily. Swallow whole.   OVER THE COUNTER MEDICATION Take 1 tablet by mouth daily. Calcium supplement   No current facility-administered medications on file prior to visit.   Medications Discontinued During This Encounter  Medication Reason   omeprazole (PRILOSEC OTC) 20 MG tablet Reorder   losartan (COZAAR) 100 MG tablet Reorder   amLODipine (NORVASC) 5 MG tablet Reorder   rosuvastatin (CRESTOR) 20 MG tablet Reorder   escitalopram (LEXAPRO) 10 MG tablet Reorder  Medications - Lexapro - Rosuvastatin - Amlodipine - Baby aspirin -  Prilosec Outpatient Medications Prior to Visit  Medication Sig   aspirin EC 81 MG tablet Take 81 mg by mouth daily. Swallow whole.   OVER THE COUNTER MEDICATION Take 1 tablet by mouth daily. Calcium supplement   [DISCONTINUED] amLODipine (NORVASC) 5 MG tablet Take 5 mg by mouth daily.   [DISCONTINUED] escitalopram (LEXAPRO) 10 MG tablet Take 10 mg by mouth daily.   [DISCONTINUED] losartan (COZAAR) 100 MG tablet Take 100 mg by mouth daily.   [DISCONTINUED] omeprazole (PRILOSEC OTC) 20 MG tablet Take 20 mg by mouth daily.   [DISCONTINUED] rosuvastatin (CRESTOR) 20 MG tablet Take 20 mg by mouth at bedtime.   No facility-administered medications prior to visit.  The following were reviewed and/or entered/updated into our electronic MEDICAL RECORD NUMBERPast Medical History:  Diagnosis Date   Anxiety    Depression    Essential hypertension 10/14/2019   GERD without esophagitis 10/14/2019   High cholesterol    Hypertension    Mixed hyperlipidemia 10/14/2019   Past Surgical History:  Procedure Laterality Date   CORONARY ULTRASOUND/IVUS Left 10/16/2019   Procedure:  Intravascular Ultrasound/IVUS;  Surgeon: Maeola Harman, MD;  Location: [facility] INVASIVE CV LAB;  Service: Cardiovascular;  Laterality: Left;  lower leg IVC   IVC VENOGRAPHY N/A 10/16/2019   Procedure: IVC Venography;  Surgeon: Maeola Harman, MD;  Location: [facility] INVASIVE CV LAB;  Service: Cardiovascular;  Laterality: N/A;   PERIPHERAL VASCULAR ATHERECTOMY Left 10/16/2019   Procedure: PERIPHERAL VASCULAR ATHERECTOMY;  Surgeon: Maeola Harman, MD;  Location: [facility] INVASIVE CV LAB;  Service: Cardiovascular;  Laterality: Left;  common/ext iliac /common fem popliteal   PERIPHERAL VASCULAR INTERVENTION Left 10/16/2019   Procedure: PERIPHERAL VASCULAR INTERVENTION;  Surgeon: Maeola Harman, MD;  Location: [facility] INVASIVE CV LAB;  Service: Cardiovascular;  Laterality: Left;  COMMON/EXT ILIAC   Social History   Socioeconomic History   Marital status: Single    Spouse name: Not on file   Number of children: Not on file   Years of education: Not on file   Highest education level: 12th grade  Occupational History   Not on file  Tobacco Use   Smoking status: Never   Smokeless tobacco: Never  Vaping Use   Vaping status: Never Used  Substance and Sexual Activity   Alcohol use: Yes   Drug use: No   Sexual activity: Yes    Birth control/protection: Condom  Other Topics Concern   Not on file  Social History Narrative   Not on file   Social Drivers of Health   Financial Resource Strain: Low Risk  (04/06/2023)   Overall Financial Resource Strain (CARDIA)    Difficulty of Paying Living Expenses: Not very hard  Food Insecurity: No Food Insecurity (04/06/2023)   Hunger Vital Sign    Worried About Running Out of Food in the Last Year: Never true    Ran Out of Food in the Last Year: Never true  Transportation Needs: No Transportation Needs (04/06/2023)   PRAPARE - Administrator, Civil Service (Medical): No    Lack of Transportation (Non-Medical): No  Physical  Activity: Insufficiently Active (04/06/2023)   Exercise Vital Sign    Days of Exercise per Week: 3 days    Minutes of Exercise per Session: 30 min  Stress: Stress Concern Present (04/06/2023)   [employer] of Occupational Health - Occupational Stress Questionnaire    Feeling of Stress : To some extent  Social Connections: Unknown (04/06/2023)   Social Connection and  Isolation Panel [NHANES]    Frequency of Communication with Friends and Family: Twice a week    Frequency of Social Gatherings with Friends and Family: Once a week    Attends Religious Services: Never    Database administrator or Organizations: No    Attends Engineer, structural: Not on file    Marital Status: Patient declined  Intimate Partner Violence: Unknown (07/03/2021)   Received from [facility]   HITS    Physically Hurt: Not on file    Insult or Talk Down To: Not on file    Threaten Physical Harm: Not on file    Scream or Curse: Not on file  Social History - Nonsmoker    04/06/2023    7:08 PM  Alcohol Use Disorder Test (AUDIT)  1. How often do you have a drink containing alcohol? 2  2. How many drinks containing alcohol do you have on a typical day when you are drinking? 0  3. How often do you have six or more drinks on one occasion? 0  AUDIT-C Score 2      Patient-reported   Family Status  Relation Name Status   Mother  Alive   Father Cayde Masley Alive  No partnership data on file  Family History - Denies blood clots in family - Father had prostate cancer Family History  Problem Relation Age of Onset   Hypertension Father    Hyperlipidemia Father    Diabetes Father    Obesity Father    Allergies  Allergen Reactions   Hydrochlorothiazide     Other reaction(s): hyponatremia   Lisinopril Cough   Simvastatin     Other reaction(s): Unknown   Patient Care Team: Lula Olszewski, MD as PCP - General (Internal Medicine)    Objective  BP (!) 177/110   Pulse 84   Temp 98.4 F (36.9  C) (Temporal)   Ht 6' (1.829 m)   Wt (!) 338 lb 3.2 oz (153.4 kg)   SpO2 95%   BMI 45.87 kg/m Physical ExamBody mass index is 45.87 kg/m. BP Readings from Last 3 Encounters:  04/13/23 (!) 177/110  04/29/21 (!) 140/94  04/12/21 124/76   Wt Readings from Last 3 Encounters:  04/13/23 (!) 338 lb 3.2 oz (153.4 kg)  04/29/21 272 lb 6.4 oz (123.6 kg)  04/12/21 264 lb (119.7 kg)   GEN: No acute distress, resting comfortably.  Truncal adiposity  HEENT: Throat with large tonsils.Tympanic membranes normal appearing bilaterally, oropharynx clear, no thyromegaly noted, no palpable lymphadenopathy or thyroid nodules. CARDIOVASCULAR: S1 and S2 heart sounds with regular rate and rhythm, no murmurs appreciated. PULMONARY: Normal work of breathing, clear to auscultation bilaterally, no crackles, wheezes, or rhonchi. ABDOMEN: Soft, nontender, nondistended. MSK: No edema, cyanosis, or clubbing noted. SKIN: Warm, dry, no lesions of concern observed. NEUROLOGICAL: Cranial nerves II-XII grossly intact, strength 5/5 in upper and lower extremities, reflexes symmetric and intact bilaterally. PSYCH: Normal affect and thought content, pleasant and cooperative.  Results Reviewed: Last depression screening scores    04/13/2023    3:13 PM  PHQ 2/9 Scores  PHQ - 2 Score 1  PHQ- 9 Score 1   Last fall risk screening    04/13/2023    3:13 PM  Fall Risk   Falls in the past year? 0  Number falls in past yr: 0  Injury with Fall? 0  Risk for fall due to : No Fall Risks  Follow up Falls evaluation completed  Lula Olszewski, MD  [facility] [phone] (phone) [phone] (fax) [facility] [place]

## 2023-04-14 LAB — CBC WITH DIFFERENTIAL/PLATELET
Basophils Absolute: 0 10*3/uL (ref 0.0–0.1)
Basophils Relative: 0.8 % (ref 0.0–3.0)
Eosinophils Absolute: 0.1 10*3/uL (ref 0.0–0.7)
Eosinophils Relative: 1.3 % (ref 0.0–5.0)
HCT: 48.4 % (ref 39.0–52.0)
Hemoglobin: 16.1 g/dL (ref 13.0–17.0)
Lymphocytes Relative: 30.8 % (ref 12.0–46.0)
Lymphs Abs: 1.8 10*3/uL (ref 0.7–4.0)
MCHC: 33.3 g/dL (ref 30.0–36.0)
MCV: 89.4 fL (ref 78.0–100.0)
Monocytes Absolute: 0.6 10*3/uL (ref 0.1–1.0)
Monocytes Relative: 10.8 % (ref 3.0–12.0)
Neutro Abs: 3.3 10*3/uL (ref 1.4–7.7)
Neutrophils Relative %: 56.3 % (ref 43.0–77.0)
Platelets: 258 10*3/uL (ref 150.0–400.0)
RBC: 5.41 Mil/uL (ref 4.22–5.81)
RDW: 14.1 % (ref 11.5–15.5)
WBC: 5.8 10*3/uL (ref 4.0–10.5)

## 2023-04-14 LAB — COMPREHENSIVE METABOLIC PANEL
ALT: 28 U/L (ref 0–53)
AST: 28 U/L (ref 0–37)
Albumin: 4.3 g/dL (ref 3.5–5.2)
Alkaline Phosphatase: 67 U/L (ref 39–117)
BUN: 14 mg/dL (ref 6–23)
CO2: 23 meq/L (ref 19–32)
Calcium: 9.7 mg/dL (ref 8.4–10.5)
Chloride: 103 meq/L (ref 96–112)
Creatinine, Ser: 0.9 mg/dL (ref 0.40–1.50)
GFR: 101.77 mL/min (ref >60.00)
Glucose, Bld: 92 mg/dL (ref 70–99)
Potassium: 3.9 meq/L (ref 3.5–5.1)
Sodium: 136 meq/L (ref 135–145)
Total Bilirubin: 0.4 mg/dL (ref 0.2–1.2)
Total Protein: 8 g/dL (ref 6.0–8.3)

## 2023-04-14 LAB — LIPID PANEL
Cholesterol: 289 mg/dL — ABNORMAL HIGH (ref 0–200)
HDL: 43.8 mg/dL (ref >39.00)
LDL Cholesterol: 180 mg/dL — ABNORMAL HIGH (ref 0–99)
NonHDL: 244.87
Total CHOL/HDL Ratio: 7
Triglycerides: 324 mg/dL — ABNORMAL HIGH (ref 0.0–149.0)
VLDL: 64.8 mg/dL — ABNORMAL HIGH (ref 0.0–40.0)

## 2023-04-14 LAB — TSH RFX ON ABNORMAL TO FREE T4: TSH: 1.98 u[IU]/mL (ref 0.450–4.500)

## 2023-04-14 LAB — HEMOGLOBIN A1C: Hgb A1c MFr Bld: 6.1 % (ref 4.6–6.5)

## 2023-04-14 NOTE — Assessment & Plan Note (Signed)
Dysthymia Chronic low-grade depression is managed with Lexapro. Therapy and the potential benefits of CPAP therapy and weight loss on mood were discussed. Continue Lexapro and consider therapy if needed. Recommend CPAP therapy for mood improvement.

## 2023-04-14 NOTE — Assessment & Plan Note (Signed)
May-Thurner Syndrome with History of Pulmonary Embolism Left iliac vein compression is managed with baby aspirin. Monitoring for recurrence and the benefits of CPAP therapy in reducing clot risk were discussed. Continue baby aspirin and monitor for symptoms of recurrence. Recommend CPAP therapy to reduce clot risk.

## 2023-04-14 NOTE — Patient Instructions (Addendum)
VISIT SUMMARY:  During today's visit, we discussed your mental health and physical well-being, including your history of cholesterol, prediabetes, high white blood cell count, GERD, and May-Thurner syndrome. We addressed your concerns about feeling down and anxious, sleep issues, eating habits, and recent emotional distress. We also reviewed your current medications and made adjustments where necessary.  YOUR PLAN:  -HYPERTENSION: Hypertension, or high blood pressure, is being managed with amlodipine and losartan. We discussed the impact of untreated sleep apnea on blood pressure and the benefits of CPAP therapy. We also talked about weight loss medications like Zepbound. You should restart losartan, continue amlodipine, and we will order a sleep study and prescribe Zepbound for weight loss. Please follow up in 2-3 months for a blood pressure check.  -PREDIABETES: Prediabetes is a condition where blood sugar levels are higher than normal but not high enough to be classified as diabetes. Managing your weight is crucial to reducing the risk of developing diabetes. We will order a full lab panel including diabetes screening and prescribe Zepbound for weight loss.  -HYPERLIPIDEMIA: Hyperlipidemia, or high cholesterol, is being managed with rosuvastatin. Please continue taking rosuvastatin as prescribed.  -GASTROESOPHAGEAL REFLUX DISEASE (GERD): GERD is a condition where stomach acid frequently flows back into the tube connecting your mouth and stomach. It is being managed with Prilosec. We discussed potential long-term side effects, including vitamin deficiencies. Please continue taking Prilosec and monitor for any vitamin deficiencies.  -DYSTHYMIA: Dysthymia is a chronic form of low-grade depression. It is being managed with Lexapro. We discussed the potential benefits of CPAP therapy and weight loss on mood. Please continue taking Lexapro and consider therapy if needed. CPAP therapy is recommended for  mood improvement.  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 You are currently managing this with baby aspirin. We discussed monitoring for recurrence and the benefits of CPAP therapy in reducing clot risk. Please continue taking baby aspirin and monitor for any symptoms of recurrence. CPAP therapy is recommended to reduce clot risk.  -GENERAL HEALTH MAINTENANCE: We discussed the importance of regular health maintenance, including annual physical exams, screenings, and vaccinations. We recommend Cologuard for colon cancer screening and a PSA test due to your family history of prostate cancer. We will order a full lab panel and Cologuard for colon cancer screening, administer a flu shot, and recommend dental and eye exams. Regular exercise and a healthy diet are also advised.  INSTRUCTIONS:  Please schedule a follow-up appointment in 2-3 months for a blood pressure check and an annual physical for next year.   Welcome aboard!   Today's visit was a valuable first step in understanding your health and starting your personalized care journey. We discussed your medical history and medications in detail. Given the extensive information, we prioritized addressing your most pressing concerns.  We understood those concerns to be:  New Patient (Initial Visit) (/) and Hypertension   Building a Complete Picture  To create the most effective care plan possible, we may need additional information from previous providers. We encouraged you to gather any relevant medical records for your next visit. This will help [place] build a more complete picture and develop a personalized plan together. In the meantime, we'll address your immediate concerns and provide resources to help you manage all of your medical issues.  We encourage you to use MyChart to review these efforts, and to help [place] find and  correct any omissions or errors in your medical chart.  Managing Your Health Over Time  Managing every aspect of your health in a single visit isn't always feasible, but that's okay.  We addressed your most pressing concerns today and charted a course for future care. Acute conditions or preventive care measures may require further attention.  We encourage you to schedule a follow-up visit at your earliest convenience to discuss any unresolved issues.  We strongly encourage participation in annual preventive care visits to help [place] develop a more thorough understanding of your health and to help you maintain optimal wellness - please inquire about scheduling your next one with [place] at your earliest convenience.  Your Satisfaction Matters  It was a pleasure seeing you today!  Your health and satisfaction will always be my top priorities. If you believe your experience today was worthy of a 5-star rating, I'd be grateful for your feedback!  Lula Olszewski, MD  Next Steps  Schedule Follow-Up:  We recommend a follow-up appointment in 1 year for your next wellness visit.  If you develop any new problems, want to address any medical issues, or your condition worsens before then, please call us for an appointment or seek emergency care. Preventive Care:  Don't forget to schedule your annual preventive care visit!  Please review your attached preventive care information. Make sure to arrange appointments for dental and vision routine screening, and use nightly nasal saline mist to keep your sinuses clear. Medical Information Release:  For any relevant medical information we don't have, please sign a release form at the front desk so we can obtain it for your records. Lab & X-ray Appointments:  Scheduled any incomplete lab tests today or call us to schedule.  X-Rays can be done without an appointment at [facility] at [facility] ([address], [address]), M-F 8:30am-noon or 1pm-5pm.  Just tell them  you're there for X-rays ordered by Dr. Jon Billings.  We'll receive the results and contact you by phone or MyChart to discuss next steps.  Making the Most of Our Focused (20 minute) Appointments:  [x]   Clearly state your top concerns at the beginning of the visit to focus our discussion [x]   If you anticipate you will need more time, please inform the front desk during scheduling - we can book multiple appointments in the same week. [x]   If you have transportation problems- use our convenient video appointments or ask about transportation support. [x]   We can get down to business faster if you use MyChart to update information before the visit and submit non-urgent questions before your visit. Thank you for taking the time to provide details through MyChart.  Let our nurse know and she can import this information into your encounter documents.  Arrival and Wait Times: [x]   Arriving on time ensures that everyone receives prompt attention. [x]   Early morning (8a) and afternoon (1p) appointments tend to have shortest wait times. [x]   Unfortunately, we cannot delay appointments for late arrivals or hold slots during phone calls.  Thank you for collaborating with [place] to prioritize your health. We look forward to serving you!  Bring to Your Next Appointment  Medications: Please bring all your medication bottles to your next appointment to ensure we have an accurate record of your prescriptions. Health Diaries: If you're monitoring any health conditions at home, keeping a diary of your readings can be very helpful for discussions at your next appointment.  Reviewing Your Records  Please Review  this early draft of your clinical notes below and the final encounter summary tomorrow on MyChart after its been completed.   Encounter for annual general medical examination with abnormal findings in adult  Need for influenza vaccination -     Flu vaccine trivalent PF, 6mos and  older(Flulaval,Afluria,Fluarix,Fluzone)  Screen for colon cancer -     Cologuard  Screen for sexually transmitted diseases -     RPR -     HSV(herpes simplex vrs) 1+2 ab-IgG -     Urine cytology ancillary only  Screening examination for infectious disease -     HIV Antibody (routine testing w rflx) -     Hepatitis C antibody  Primary hypertension -     amLODIPine Besylate; Take 1 tablet (5 mg total) by mouth daily.  Dispense: 90 tablet; Refill: 3 -     Losartan Potassium; Take 1 tablet (100 mg total) by mouth daily.  Dispense: 90 tablet; Refill: 3  Morbid obesity (HCC) -     Lipid panel -     Comprehensive metabolic panel -     CBC with Differential/Platelet -     TSH Rfx on Abnormal to Free T4 -     Hemoglobin A1c  Other elevated white blood cell count  Prediabetes -     Lipid panel -     Comprehensive metabolic panel -     CBC with Differential/Platelet -     TSH Rfx on Abnormal to Free T4 -     Hemoglobin A1c  Pure hypercholesterolemia  Mixed hyperlipidemia -     Rosuvastatin Calcium; Take 1 tablet (20 mg total) by mouth at bedtime.  Dispense: 90 tablet; Refill: 3 -     Lipid panel -     Comprehensive metabolic panel -     CBC with Differential/Platelet -     TSH Rfx on Abnormal to Free T4 -     Hemoglobin A1c  May-Thurner syndrome  Essential hypertension  GERD without esophagitis -     Omeprazole Magnesium; Take 1 tablet (20 mg total) by mouth daily.  Dispense: 90 tablet; Refill: 3  Elevated troponin level not due myocardial infarction  Dysthymia -     Escitalopram Oxalate; Take 1 tablet (10 mg total) by mouth daily.  Dispense: 90 tablet; Refill: 3  Dyslipidemia  Body mass index (BMI) 40.0-44.9, adult (HCC) -     Tirzepatide-Weight Management; Inject 2.5 mg into the skin once a week.  Dispense: 2 mL; Refill: 11  History of pulmonary embolism  Snoring -     Ambulatory referral to Sleep Studies  FH: prostate cancer  Encounter for annual health  examination  Preventative health care  Healthcare maintenance     Getting Answers and Following Up  Simple Questions & Concerns: For quick questions or basic follow-up after your visit, reach [place] at [phone] or MyChart messaging. Complex Concerns: If your concern is more complex, scheduling an appointment might be best. Discuss this with the staff to find the most suitable option. Lab & Imaging Results: We'll contact you directly if results are abnormal or you don't use MyChart. Most normal results will be on MyChart within 2-3 business days, with a review message from Dr. Jon Billings. Haven't heard back in 2 weeks? Need results sooner? Contact us at [phone]. Referrals: Our referral coordinator will manage specialist referrals. The specialist's office should contact you within 2 weeks to schedule an appointment. Call us if you haven't heard from them after 2  weeks.  Staying Connected  MyChart: Activate your MyChart for the fastest way to access results and message [place]. See the last page of this paperwork for instructions on how to activate.  Billing  X-ray & Lab Orders: These are billed by separate companies. Contact the invoicing company directly for questions or concerns. Visit Charges: Discuss any billing inquiries with our administrative services team.  Feedback & Satisfaction  Share Your Experience: We strive for your satisfaction! If you have any complaints, or preferably compliments, please let Dr. Jon Billings know directly or contact our Practice Administrators, Edwena Felty or [employer], by asking at the front desk.   Scheduling Tips  Shorter Wait Times: 8 am and 1 pm appointments often have the quickest wait times. Longer Appointments: If you need more time during your visit, talk to the front desk. Due to insurance regulations, multiple back-to-back appointments might be necessary.

## 2023-04-14 NOTE — Progress Notes (Signed)
I've reviewed the results and they are all essentially normal, except for cholesterol levels. Sent MyChart message to explain

## 2023-04-14 NOTE — Assessment & Plan Note (Signed)
Gastroesophageal Reflux Disease (GERD) Chronic GERD is managed with Prilosec. Potential long-term side effects, including vitamin deficiencies, were discussed. Continue Prilosec and monitor for vitamin deficiencies.

## 2023-04-14 NOTE — Assessment & Plan Note (Signed)
Prediabetes Prediabetes management focuses on weight management and the benefits of weight loss medications to reduce diabetes risk. Order a full lab panel including diabetes screening and prescribe Zepbound for weight loss.

## 2023-04-14 NOTE — Assessment & Plan Note (Signed)
Hyperlipidemia Chronic hyperlipidemia is managed with rosuvastatin. Continue rosuvastatin.

## 2023-04-15 LAB — HSV(HERPES SIMPLEX VRS) I + II AB-IGG
HSV 1 IGG,TYPE SPECIFIC AB: <0.9 {index}
HSV 2 IGG,TYPE SPECIFIC AB: <0.9 {index}

## 2023-04-15 LAB — RPR TITER: RPR Titer: 1:4 {titer} — ABNORMAL HIGH

## 2023-04-15 LAB — RPR: RPR Ser Ql: REACTIVE — AB

## 2023-04-15 LAB — HIV ANTIBODY (ROUTINE TESTING W REFLEX): HIV 1&2 Ab, 4th Generation: NONREACTIVE

## 2023-04-15 LAB — HEPATITIS C ANTIBODY: Hepatitis C Ab: NONREACTIVE

## 2023-04-15 LAB — T PALLIDUM AB: T Pallidum Abs: POSITIVE — AB

## 2023-04-16 ENCOUNTER — Encounter: Payer: Self-pay | Admitting: Internal Medicine

## 2023-04-18 NOTE — Telephone Encounter (Signed)
Patient's review of lab results/notes confirmed.

## 2023-04-18 NOTE — Telephone Encounter (Signed)
Patient's review of lab results/notes confirmed. Spoke with patient and scheduled him for an follow-up visit to get treatment on 1/22.

## 2023-04-18 NOTE — Telephone Encounter (Signed)
Reported results to the [place] [facility] and [employer].

## 2023-04-19 ENCOUNTER — Telehealth: Payer: Self-pay

## 2023-04-19 ENCOUNTER — Other Ambulatory Visit (HOSPITAL_COMMUNITY): Payer: Self-pay

## 2023-04-19 NOTE — Telephone Encounter (Signed)
*  Primary  Pharmacy Patient Advocate Encounter   Received notification from Fax that prior authorization for Zepbound 2.5MG /0.5ML pen-injectors  is required/requested.   Insurance verification completed.   The patient is insured through [facility] .   Per test claim: PA required; PA submitted to above mentioned insurance via CoverMyMeds Key/confirmation #/[id number] [facility] Status is pending

## 2023-04-20 ENCOUNTER — Ambulatory Visit: Payer: Managed Care, Other (non HMO) | Admitting: Internal Medicine

## 2023-04-20 ENCOUNTER — Encounter: Payer: Self-pay | Admitting: Internal Medicine

## 2023-04-20 ENCOUNTER — Other Ambulatory Visit (HOSPITAL_COMMUNITY)
Admission: RE | Admit: 2023-04-20 | Discharge: 2023-04-20 | Disposition: A | Discharge status: Home / Self Care | Payer: Managed Care, Other (non HMO) | Source: Ambulatory Visit

## 2023-04-20 ENCOUNTER — Other Ambulatory Visit (HOSPITAL_COMMUNITY)
Admission: RE | Admit: 2023-04-20 | Discharge: 2023-04-20 | Disposition: A | Discharge status: Home / Self Care | Payer: Managed Care, Other (non HMO) | Source: Ambulatory Visit | Attending: Internal Medicine | Admitting: Internal Medicine

## 2023-04-20 VITALS — BP 145/92 | HR 110 | Temp 97.9°F | Ht 72.0 in | Wt 330.8 lb

## 2023-04-20 DIAGNOSIS — Z113 Encounter for screening for infections with a predominantly sexual mode of transmission: Secondary | ICD-10-CM

## 2023-04-20 DIAGNOSIS — A539 Syphilis, unspecified: Secondary | ICD-10-CM | POA: Insufficient documentation

## 2023-04-20 DIAGNOSIS — I10 Essential (primary) hypertension: Secondary | ICD-10-CM

## 2023-04-20 LAB — CYTOLOGY, (ORAL, ANAL, URETHRAL) ANCILLARY ONLY
Chlamydia: NEGATIVE
Comment: NEGATIVE
Comment: NEGATIVE
Comment: NORMAL
Neisseria Gonorrhea: NEGATIVE
Trichomonas: NEGATIVE

## 2023-04-20 MED ORDER — PENICILLIN G BENZATHINE 1200000 UNIT/2ML IM SUSY
1.2000 10*6.[IU] | PREFILLED_SYRINGE | Freq: Once | INTRAMUSCULAR | Status: AC
  Administered 2023-04-20: 1.2 10*6.[IU] via INTRAMUSCULAR

## 2023-04-21 LAB — CYTOLOGY, (ORAL, ANAL, URETHRAL) ANCILLARY ONLY
Chlamydia: NEGATIVE
Comment: NEGATIVE
Comment: NORMAL
Neisseria Gonorrhea: NEGATIVE

## 2023-04-21 NOTE — Telephone Encounter (Signed)
Called and spoke with the lab. Canceled the Cytology, non pap order and replaced it with the Cytology ancillary per their request. Also, was informed that only gonorrhea and chlamydia can be done with an anal swab, not herpes or HPV.

## 2023-04-21 NOTE — Progress Notes (Signed)
==============================  [facility]: [phone]   -- Medical Office Visit --  Patient: Christian Stevens      Age: 48 y.o.       Sex:  male  Date:   04/20/2023 Today's Healthcare Provider: Lula Olszewski, MD  ==============================   CHIEF COMPLAINT: Hypertension follow-up and STI treatment (Bicillin injection weekly for three weeks.)  SUBJECTIVE: Background This is a 48 y.o. male who has Elevated troponin level not due myocardial infarction; Mixed hyperlipidemia; Essential hypertension; GERD without esophagitis; May-Thurner syndrome; Body mass index (BMI) 40.0-44.9, adult (HCC); Dyslipidemia; Morbid obesity (HCC); Other elevated white blood cell count; Prediabetes; Pure hypercholesterolemia; Dysthymia; and History of pulmonary embolism on their problem list.  History of Present Illness The patient, in his forties, presented for treatment of a confirmed syphilis infection, detected through RPR and Darkfield tests, with a low titer RPR of 1:4. The patient reported no prior treatment or detection of syphilis. The infection was reportedly contracted through a former partner who had been unfaithful. The patient denied experiencing any symptoms, including the presence of a shanker or any neurological signs suggestive of neurosyphilis.  The patient also reported a history of receptive anal intercourse. However, he denied any abnormal bowel movements or urinary symptoms. The patient was also being treated for hypertension, with a current regimen of Amlodipine 5mg  and Losartan 100mg . Despite medication, the patient's blood pressure readings remained high, although he believed this could be due to the stress of the current situation.  The patient was employed and did not report any other significant health issues. He was cooperative and willing to undergo necessary tests and treatments to ensure his sexual health.   Reviewed chart  records that patient  has a past medical history of Anxiety, Depression, Essential hypertension (10/14/2019), GERD without esophagitis (10/14/2019), High cholesterol, Hypertension, and Mixed hyperlipidemia (10/14/2019).  Discussed Past Medical History - High blood pressure    Problem list overviews that were updated at today's visit:No problems updated.  Today's Verbally Confirmed Medications - Amlodipine 5 mg - Losartan 100 mg Current Outpatient Medications on File Prior to Visit  Medication Sig   amLODipine (NORVASC) 5 MG tablet Take 1 tablet (5 mg total) by mouth daily.   aspirin EC 81 MG tablet Take 81 mg by mouth daily. Swallow whole.   escitalopram (LEXAPRO) 10 MG tablet Take 1 tablet (10 mg total) by mouth daily.   losartan (COZAAR) 100 MG tablet Take 1 tablet (100 mg total) by mouth daily.   omeprazole (PRILOSEC OTC) 20 MG tablet Take 1 tablet (20 mg total) by mouth daily.   OVER THE COUNTER MEDICATION Take 1 tablet by mouth daily. Calcium supplement   rosuvastatin (CRESTOR) 20 MG tablet Take 1 tablet (20 mg total) by mouth at bedtime.   tirzepatide (ZEPBOUND) 2.5 MG/0.5ML injection vial Inject 2.5 mg into the skin once a week.   No current facility-administered medications on file prior to visit.  There are no discontinued medications.    Objective   Physical Exam     04/20/2023   11:38 AM 04/20/2023   11:35 AM 04/13/2023    3:22 PM  Vitals with BMI  Height  6\' 0"    Weight  330 lbs 13 oz   BMI  44.85   Systolic 145 150 621  Diastolic 92 77 110  Pulse  110    Wt Readings from Last 10 Encounters:  04/20/23 (!) 330 lb 12.8 oz (150 kg)  04/13/23 Marland Kitchen)  338 lb 3.2 oz (153.4 kg)  04/29/21 272 lb 6.4 oz (123.6 kg)  04/12/21 264 lb (119.7 kg)  09/22/20 (!) 327 lb 3.2 oz (148.4 kg)  06/24/20 (!) 326 lb 12.8 oz (148.2 kg)  06/19/20 (!) 323 lb 14.4 oz (146.9 kg)  11/23/19 (!) 310 lb (140.6 kg)  11/08/19 (!) 312 lb 4.8 oz (141.7 kg)  10/17/19 (!) 318 lb 9 oz (144.5 kg)    Vital signs reviewed.  Nursing notes reviewed. Weight trend reviewed. Abnormalities and Problem-Specific physical exam findings:  RECTAL: Anus without lesions, discharge, or abnormalities; rectum clear, no fecal matter observed. Anal mucosa appears healthy.  Procedure: Anal Cytology Test Description: An anal dilator was inserted. The swab was introduced, swirled, and placed in a tube. The anal mucosa appeared healthy without discharge. General Appearance:  No acute distress appreciable.   Well-groomed, healthy-appearing male.  Well proportioned with no abnormal fat distribution.  Good muscle tone. Pulmonary:  Normal work of breathing at rest, no respiratory distress apparent. SpO2: 96 %  Musculoskeletal: All extremities are intact.  Neurological:  Awake, alert, oriented, and engaged.  No obvious focal neurological deficits or cognitive impairments.  Sensorium seems unclouded.   Speech is clear and coherent with logical content. Psychiatric:  Appropriate mood, pleasant and cooperative demeanor, thoughtful and engaged during the exam  RPR: 1:4 Darkfield microscopy: Positive HSV antibody: Negative   Results for orders placed or performed in visit on 04/20/23  Cytology (oral, anal, urethral) ancillary only  Result Value Ref Range   Neisseria Gonorrhea Negative    Chlamydia Negative    Trichomonas Negative    Comment Normal Reference Range Trichomonas - Negative    Comment Normal Reference Ranger Chlamydia - Negative    Comment      Normal Reference Range Neisseria Gonorrhea - Negative   Office Visit on 04/20/2023  Component Date Value   Neisseria Gonorrhea 04/20/2023 Negative    Chlamydia 04/20/2023 Negative    Trichomonas 04/20/2023 Negative    Comment 04/20/2023 Normal Reference Range Trichomonas - Negative    Comment 04/20/2023 Normal Reference Ranger Chlamydia - Negative    Comment 04/20/2023 Normal Reference Range Neisseria Gonorrhea - Negative   Office Visit on 04/13/2023   Component Date Value   RPR Ser Ql 04/13/2023 REACTIVE (A)    HIV 1&2 Ab, 4th Generati* 04/13/2023 NON-REACTIVE    Hepatitis C Ab 04/13/2023 NON-REACTIVE    HSV 1 IGG,TYPE SPECIFIC * 04/13/2023 <0.90    HSV 2 IGG,TYPE SPECIFIC * 04/13/2023 <0.90    Cholesterol 04/13/2023 289 (H)    Triglycerides 04/13/2023 324.0 (H)    HDL 04/13/2023 43.80    VLDL 04/13/2023 64.8 (H)    LDL Cholesterol 04/13/2023 180 (H)    Total CHOL/HDL Ratio 04/13/2023 7    NonHDL 04/13/2023 244.87    Sodium 04/13/2023 136    Potassium 04/13/2023 3.9    Chloride 04/13/2023 103    CO2 04/13/2023 23    Glucose, Bld 04/13/2023 92    BUN 04/13/2023 14    Creatinine, Ser 04/13/2023 0.90    Total Bilirubin 04/13/2023 0.4    Alkaline Phosphatase 04/13/2023 67    AST 04/13/2023 28    ALT 04/13/2023 28    Total Protein 04/13/2023 8.0    Albumin 04/13/2023 4.3    GFR 04/13/2023 101.77    Calcium 04/13/2023 9.7    WBC 04/13/2023 5.8    RBC 04/13/2023 5.41    Hemoglobin 04/13/2023 16.1    HCT 04/13/2023 48.4  MCV 04/13/2023 89.4    MCHC 04/13/2023 33.3    RDW 04/13/2023 14.1    Platelets 04/13/2023 258.0    Neutrophils Relative % 04/13/2023 56.3    Lymphocytes Relative 04/13/2023 30.8    Monocytes Relative 04/13/2023 10.8    Eosinophils Relative 04/13/2023 1.3    Basophils Relative 04/13/2023 0.8    Neutro Abs 04/13/2023 3.3    Lymphs Abs 04/13/2023 1.8    Monocytes Absolute 04/13/2023 0.6    Eosinophils Absolute 04/13/2023 0.1    Basophils Absolute 04/13/2023 0.0    TSH 04/13/2023 1.980    Hgb A1c MFr Bld 04/13/2023 6.1    RPR Titer 04/13/2023 1:4 (H)    T Pallidum Abs 04/13/2023 POSITIVE (A)   No image results found. No results found.No results found.     Assessment & Plan Syphilis in male Syphilis Confirmed syphilis infection with a low titer RPR of 1:4 indicates a mild infection. There are no symptoms of neurosyphilis or chancres. Discussed potential complications if untreated, such as  neurosyphilis and stroke. No penicillin allergy is reported. Administer the first dose of penicillin injection in the thigh and schedule weekly injections for the next two weeks. Provide educational materials on syphilis and its transmission, and notify sexual partners. Screen for sexually transmitted diseases Sexually Transmitted Infection Screening Screening for gonorrhea, chlamydia, and HSV is necessary. Previous HSV antibody test was negative. Urine and anal swabs are recommended due to sexual practices. Discussed the importance of these tests and the need for different antibiotics if other infections are detected. Collect a urine sample and perform anal swab for gonorrhea and chlamydia testing. Perform urethral swab if consented and conduct an anal cytology test for HPV screening. Essential hypertension Hypertension Elevated blood pressure readings are likely exacerbated by stress related to the syphilis diagnosis. Currently on amlodipine 5 mg and losartan 100 mg. Advised home monitoring and reporting of blood pressure readings. Discussed increasing amlodipine to 10 mg if readings remain high. Home monitoring is preferred before medication adjustments. Monitor blood pressure at home and report readings. Consider increasing amlodipine to 10 mg if home readings remain high. Reassess blood pressure management at the next visit.  General Health Maintenance Discussed the importance of regular STI screenings and annual anal Pap smears due to sexual practices. Schedule an annual anal Pap smear for HPV screening.  Follow-up Schedule a follow-up visit next week for the second penicillin injection and review home blood pressure readings at the next visit.     Orders Placed During this Encounter:  No orders of the defined types were placed in this encounter.  Meds ordered this encounter  Medications   penicillin g benzathine (BICILLIN LA) 1200000 UNIT/2ML injection 1.2 Million Units    Antibiotic  Indication::   Syphilis   Medical Decision Making: 2 or more stable chronic illnesses Prescription drug management       This document was synthesized by artificial intelligence (Abridge) using HIPAA-compliant recording of the clinical interaction;   We discussed the use of AI scribe software for clinical note transcription with the patient, who gave verbal consent to proceed.    Additional Info: This encounter employed state-of-the-art, real-time, collaborative documentation. The patient actively reviewed and assisted in updating their electronic medical record on a shared screen, ensuring transparency and facilitating joint problem-solving for the problem list, overview, and plan. This approach promotes accurate, informed care. The treatment plan was discussed and reviewed in detail, including medication safety, potential side effects, and all patient questions. We confirmed understanding  and comfort with the plan. Follow-up instructions were established, including contacting the office for any concerns, returning if symptoms worsen, persist, or new symptoms develop, and precautions for potential emergency department visits.

## 2023-04-21 NOTE — Assessment & Plan Note (Signed)
Hypertension Elevated blood pressure readings are likely exacerbated by stress related to the syphilis diagnosis. Currently on amlodipine 5 mg and losartan 100 mg. Advised home monitoring and reporting of blood pressure readings. Discussed increasing amlodipine to 10 mg if readings remain high. Home monitoring is preferred before medication adjustments. Monitor blood pressure at home and report readings. Consider increasing amlodipine to 10 mg if home readings remain high. Reassess blood pressure management at the next visit.

## 2023-04-21 NOTE — Patient Instructions (Signed)
VISIT SUMMARY:  Today, we addressed your confirmed syphilis infection, discussed the importance of screening for other sexually transmitted infections (STIs), and reviewed your current hypertension management. We also talked about general health maintenance, including regular STI screenings and annual anal Pap smears.  YOUR PLAN:  -SYPHILIS: Syphilis is a bacterial infection usually spread by sexual contact. You have a mild infection with no symptoms of neurosyphilis or chancres. We administered the first dose of penicillin in your thigh and scheduled weekly injections for the next two weeks. Please review the educational materials provided and notify your sexual partners.  -SEXUALLY TRANSMITTED INFECTION SCREENING: Screening for other STIs like gonorrhea, chlamydia, and HSV is important due to your sexual practices. We collected a urine sample and performed an anal swab for gonorrhea and chlamydia testing. We also discussed the need for different antibiotics if other infections are detected.  -HYPERTENSION: Hypertension, or high blood pressure, can be worsened by stress. You are currently taking amlodipine 5 mg and losartan 100 mg. We advised you to monitor your blood pressure at home and report the readings. If your readings remain high, we may increase your amlodipine to 10 mg. We will reassess your blood pressure management at your next visit.  -GENERAL HEALTH MAINTENANCE: Regular STI screenings and annual anal Pap smears are important for your sexual health. We discussed scheduling an annual anal Pap smear for HPV screening.  INSTRUCTIONS:  Please schedule a follow-up visit next week for your second penicillin injection and bring your home blood pressure readings to review at the next visit.

## 2023-04-21 NOTE — Telephone Encounter (Signed)
Copied from CRM [id number]. Topic: Clinical - Lab/Test Results >> Apr 21, 2023  9:04 AM Elizebeth Brooking wrote: Reason for CRM: Lab called back stating that Dr.Morrison will need to correct the order and send it back . They are unable to do heroes 1 and 2 off a swab . Is requesting a callback at [phone]

## 2023-04-21 NOTE — Telephone Encounter (Signed)
Not sure, the person I spoke with from the lab informed me that it can't be done at all.

## 2023-04-22 NOTE — Telephone Encounter (Signed)
Pharmacy Patient Advocate Encounter  Received notification from [facility] that Prior Authorization for Zepbound 2.5MG /0.5ML pen-injectors  has been DENIED.  Full denial letter will be uploaded to the media tab. See denial reason below.   PA #/Case ID/Reference #: We have received a request for Zepbound. You are requesting this medication to use for weight loss. We reviewed the records received and your benefit plan coverage. Based on this information, the requested service is excluded from coverage under the health plan. This is not a covered benefit. Therefore, this request has not been approved. Please speak to your provider about your options for care.

## 2023-04-26 LAB — COLOGUARD

## 2023-04-27 ENCOUNTER — Ambulatory Visit (INDEPENDENT_AMBULATORY_CARE_PROVIDER_SITE_OTHER): Payer: Managed Care, Other (non HMO) | Admitting: Internal Medicine

## 2023-04-27 ENCOUNTER — Encounter: Payer: Self-pay | Admitting: Internal Medicine

## 2023-04-27 VITALS — BP 141/88 | HR 81 | Temp 97.9°F | Ht 72.0 in | Wt 331.0 lb

## 2023-04-27 DIAGNOSIS — F50813 Binge eating disorder, extreme: Secondary | ICD-10-CM

## 2023-04-27 DIAGNOSIS — I10 Essential (primary) hypertension: Secondary | ICD-10-CM

## 2023-04-27 DIAGNOSIS — A539 Syphilis, unspecified: Secondary | ICD-10-CM

## 2023-04-27 MED ORDER — AMLODIPINE BESYLATE 10 MG PO TABS: 10.0000 mg | ORAL_TABLET | Freq: Every day | ORAL | 3 refills | Status: AC

## 2023-04-27 MED ORDER — PENICILLIN G BENZATHINE 1200000 UNIT/2ML IM SUSY
1.2000 10*6.[IU] | PREFILLED_SYRINGE | Freq: Once | INTRAMUSCULAR | Status: AC
  Administered 2023-04-27: 1.2 10*6.[IU] via INTRAMUSCULAR

## 2023-04-27 NOTE — Progress Notes (Unsigned)
==============================  [facility]: [phone]   -- Medical Office Visit --  Patient: Christian Stevens      Age: 48 y.o.       Sex:  male  Date:   04/27/2023 Today's Healthcare Provider: Lula Olszewski, MD  ==============================   CHIEF COMPLAINT: One week follow-up  SUBJECTIVE: Background This is a 48 y.o. male who has Elevated troponin level not due myocardial infarction; Mixed hyperlipidemia; Essential hypertension; GERD without esophagitis; May-Thurner syndrome; Body mass index (BMI) 40.0-44.9, adult (HCC); Dyslipidemia; Morbid obesity (HCC); Other elevated white blood cell count; Prediabetes; Pure hypercholesterolemia; Dysthymia; and History of pulmonary embolism on their problem list.  ***   Reviewed chart records that patient  has a past medical history of Anxiety, Depression, Essential hypertension (10/14/2019), GERD without esophagitis (10/14/2019), High cholesterol, Hypertension, and Mixed hyperlipidemia (10/14/2019).  Discussed ***    Problem list overviews that were updated at today's visit:No problems updated.  Today's Verbally Confirmed *** Current Outpatient Medications on File Prior to Visit  Medication Sig  . amLODipine (NORVASC) 5 MG tablet Take 1 tablet (5 mg total) by mouth daily.  Marland Kitchen aspirin EC 81 MG tablet Take 81 mg by mouth daily. Swallow whole.  . escitalopram (LEXAPRO) 10 MG tablet Take 1 tablet (10 mg total) by mouth daily.  Marland Kitchen losartan (COZAAR) 100 MG tablet Take 1 tablet (100 mg total) by mouth daily.  Marland Kitchen omeprazole (PRILOSEC OTC) 20 MG tablet Take 1 tablet (20 mg total) by mouth daily.  Marland Kitchen OVER THE COUNTER MEDICATION Take 1 tablet by mouth daily. Calcium supplement  . rosuvastatin (CRESTOR) 20 MG tablet Take 1 tablet (20 mg total) by mouth at bedtime.  . tirzepatide (ZEPBOUND) 2.5 MG/0.5ML injection vial Inject 2.5 mg into the skin once a week.   No current facility-administered  medications on file prior to visit.  There are no discontinued medications.    Objective   Physical Exam     04/27/2023   11:38 AM 04/27/2023   11:30 AM 04/20/2023   11:38 AM  Vitals with BMI  Height  6\' 0"    Weight  331 lbs   BMI  44.88   Systolic 141 152 578  Diastolic 88 94 92  Pulse  81    Wt Readings from Last 10 Encounters:  04/27/23 (!) 331 lb (150.1 kg)  04/20/23 (!) 330 lb 12.8 oz (150 kg)  04/13/23 (!) 338 lb 3.2 oz (153.4 kg)  04/29/21 272 lb 6.4 oz (123.6 kg)  04/12/21 264 lb (119.7 kg)  09/22/20 (!) 327 lb 3.2 oz (148.4 kg)  06/24/20 (!) 326 lb 12.8 oz (148.2 kg)  06/19/20 (!) 323 lb 14.4 oz (146.9 kg)  11/23/19 (!) 310 lb (140.6 kg)  11/08/19 (!) 312 lb 4.8 oz (141.7 kg)   Vital signs reviewed.  Nursing notes reviewed. Weight trend reviewed. Abnormalities and Problem-Specific physical exam findings:  ***  General Appearance:  No acute distress appreciable.   Well-groomed, healthy-appearing male.  Well proportioned with no abnormal fat distribution.  Good muscle tone. Pulmonary:  Normal work of breathing at rest, no respiratory distress apparent. SpO2: 95 %  Musculoskeletal: All extremities are intact.  Neurological:  Awake, alert, oriented, and engaged.  No obvious focal neurological deficits or cognitive impairments.  Sensorium seems unclouded.   Speech is clear and coherent with logical content. Psychiatric:  Appropriate mood, pleasant and cooperative demeanor, thoughtful and engaged during the exam  {Insert previous labs (optional):23779} {See past  labs  Heme  Chem  Endocrine  Serology  Results Review (optional):1} No results found for any visits on 04/27/23. Office Visit on 04/20/2023  Component Date Value  . Neisseria Gonorrhea 04/20/2023 Negative   . Chlamydia 04/20/2023 Negative   . Trichomonas 04/20/2023 Negative   . Comment 04/20/2023 Normal Reference Range Trichomonas - Negative   . Comment 04/20/2023 Normal Reference Ranger Chlamydia -  Negative   . Comment 04/20/2023 Normal Reference Range Neisseria Gonorrhea - Negative   . Neisseria Gonorrhea 04/20/2023 Negative   . Chlamydia 04/20/2023 Negative   . Comment 04/20/2023 Normal Reference Ranger Chlamydia - Negative   . Comment 04/20/2023 Normal Reference Range Neisseria Gonorrhea - Negative   Office Visit on 04/13/2023  Component Date Value  . COLOGUARD 04/26/2023 Sample Could Not Be Processed 9   . RPR Ser Ql 04/13/2023 REACTIVE (A)   . HIV 1&2 Ab, 4th Generati* 04/13/2023 NON-REACTIVE   . Hepatitis C Ab 04/13/2023 NON-REACTIVE   . HSV 1 IGG,TYPE SPECIFIC * 04/13/2023 <0.90   . HSV 2 IGG,TYPE SPECIFIC * 04/13/2023 <0.90   . Cholesterol 04/13/2023 289 (H)   . Triglycerides 04/13/2023 324.0 (H)   . HDL 04/13/2023 43.80   . VLDL 04/13/2023 64.8 (H)   . LDL Cholesterol 04/13/2023 180 (H)   . Total CHOL/HDL Ratio 04/13/2023 7   . NonHDL 04/13/2023 244.87   . Sodium 04/13/2023 136   . Potassium 04/13/2023 3.9   . Chloride 04/13/2023 103   . CO2 04/13/2023 23   . Glucose, Bld 04/13/2023 92   . BUN 04/13/2023 14   . Creatinine, Ser 04/13/2023 0.90   . Total Bilirubin 04/13/2023 0.4   . Alkaline Phosphatase 04/13/2023 67   . AST 04/13/2023 28   . ALT 04/13/2023 28   . Total Protein 04/13/2023 8.0   . Albumin 04/13/2023 4.3   . GFR 04/13/2023 101.77   . Calcium 04/13/2023 9.7   . WBC 04/13/2023 5.8   . RBC 04/13/2023 5.41   . Hemoglobin 04/13/2023 16.1   . HCT 04/13/2023 48.4   . MCV 04/13/2023 89.4   . MCHC 04/13/2023 33.3   . RDW 04/13/2023 14.1   . Platelets 04/13/2023 258.0   . Neutrophils Relative % 04/13/2023 56.3   . Lymphocytes Relative 04/13/2023 30.8   . Monocytes Relative 04/13/2023 10.8   . Eosinophils Relative 04/13/2023 1.3   . Basophils Relative 04/13/2023 0.8   . Neutro Abs 04/13/2023 3.3   . Lymphs Abs 04/13/2023 1.8   . Monocytes Absolute 04/13/2023 0.6   . Eosinophils Absolute 04/13/2023 0.1   . Basophils Absolute 04/13/2023 0.0   . TSH  04/13/2023 1.980   . Hgb A1c MFr Bld 04/13/2023 6.1   . RPR Titer 04/13/2023 1:4 (H)   . T Pallidum Abs 04/13/2023 POSITIVE (A)   No image results found. No results found.No results found.     Assessment & Plan Syphilis in male  Hypertension, unspecified type  Extreme binge-eating disorder  ***     Orders Placed During this Encounter:  No orders of the defined types were placed in this encounter.  Meds ordered this encounter  Medications  . penicillin g benzathine (BICILLIN LA) 1200000 UNIT/2ML injection 1.2 Million Units    Antibiotic Indication::   Syphilis       This document was synthesized by artificial intelligence (Abridge) using HIPAA-compliant recording of the clinical interaction;   We discussed the use of AI scribe software for clinical note transcription with the patient, who  gave verbal consent to proceed.    Additional Info: This encounter employed state-of-the-art, real-time, collaborative documentation. The patient actively reviewed and assisted in updating their electronic medical record on a shared screen, ensuring transparency and facilitating joint problem-solving for the problem list, overview, and plan. This approach promotes accurate, informed care. The treatment plan was discussed and reviewed in detail, including medication safety, potential side effects, and all patient questions. We confirmed understanding and comfort with the plan. Follow-up instructions were established, including contacting the office for any concerns, returning if symptoms worsen, persist, or new symptoms develop, and precautions for potential emergency department visits.

## 2023-04-28 ENCOUNTER — Other Ambulatory Visit: Payer: Self-pay

## 2023-04-28 ENCOUNTER — Telehealth: Payer: Self-pay

## 2023-04-28 DIAGNOSIS — Z6841 Body Mass Index (BMI) 40.0 and over, adult: Secondary | ICD-10-CM

## 2023-04-28 MED ORDER — ZEPBOUND 2.5 MG/0.5ML ~~LOC~~ SOAJ: 2.5000 mg | SUBCUTANEOUS | 1 refills | Status: AC

## 2023-04-28 NOTE — Patient Instructions (Signed)
VISIT SUMMARY:  You came in today for a follow-up on your positive RPR test and syphilis infection discovered during STI screening. We also discussed your hypertension, weight management challenges, and the upcoming sleep study.  YOUR PLAN:  -SYPHILIS INFECTION: Syphilis is a sexually transmitted infection caused by the bacterium Treponema pallidum. You are currently undergoing treatment with penicillin injections, and you have received two out of the three scheduled doses. The final injection is pending. It is important to abstain from sexual activity for one month to prevent transmission. We will follow up in six months to confirm the resolution of the infection.  -HYPERTENSION: Hypertension, or high blood pressure, is a condition where the force of the blood against your artery walls is too high. Your blood pressure remains elevated, and we have decided to increase your amlodipine dosage to 10 mg. Additionally, weight loss and the upcoming sleep apnea screening on May 18, 2023, are recommended to help manage your blood pressure. Please monitor your blood pressure regularly.  -BINGE EATING DISORDER: Binge eating disorder involves consuming large amounts of food in a short period, often due to stress. We discussed the potential benefits of Vyvanse for managing this condition, but a psychiatrist needs to confirm the diagnosis before we can proceed with the prescription. You will be referred to a psychiatrist for further evaluation.  -GENERAL HEALTH MAINTENANCE: Regular health screenings and lifestyle modifications are crucial for maintaining good health. Given your history of receptive intercourse, an annual anal Pap test for HPV screening is recommended. Although your previous sample had lab issues, it is still advisable to recollect the sample if desired. Regular monitoring of your health parameters is also encouraged.  INSTRUCTIONS:  Please follow up in six months to confirm the resolution of  your syphilis infection. Schedule monthly weight loss monitoring if desired. Follow up sooner if you experience any issues with your blood pressure or sleep apnea.

## 2023-04-28 NOTE — Telephone Encounter (Signed)
Sent my chart message informing patient of this denial. Advised him to call his insurance company to find out what weight loss injections/medications they will cover, if any. Then let us know so that we can send it in.

## 2023-04-28 NOTE — Telephone Encounter (Signed)
Called the lab back again and was told that they can't test for HPV or herpes with an anal swab at their lab specifically.

## 2023-04-29 ENCOUNTER — Other Ambulatory Visit (HOSPITAL_COMMUNITY): Payer: Self-pay

## 2023-05-04 ENCOUNTER — Ambulatory Visit: Payer: Managed Care, Other (non HMO)

## 2023-05-04 DIAGNOSIS — A539 Syphilis, unspecified: Secondary | ICD-10-CM | POA: Diagnosis not present

## 2023-05-04 MED ORDER — PENICILLIN G BENZATHINE 1200000 UNIT/2ML IM SUSY
1.2000 10*6.[IU] | PREFILLED_SYRINGE | Freq: Once | INTRAMUSCULAR | Status: AC
  Administered 2023-05-04: 1.2 10*6.[IU] via INTRAMUSCULAR

## 2023-05-04 NOTE — Progress Notes (Signed)
 Patient was given his third bicillin injection in the right thigh today. Patient tolerated injection well. Donzetta Starch, CMA

## 2023-05-11 ENCOUNTER — Institutional Professional Consult (permissible substitution): Payer: 59 | Admitting: Neurology

## 2023-06-21 ENCOUNTER — Telehealth: Payer: Self-pay

## 2023-06-21 NOTE — Telephone Encounter (Signed)
 Copied from CRM [id number]. Topic: Clinical - Medication Question >> Jun 21, 2023  9:13 AM Mackie Pai E wrote: Reason for CRM: Barbara Cower form [employer] called wanting to know the dates the patient received the Bicillin antibiotic and his sexual history.Callback number for Barbara Cower is [phone] to discuss.     Called and left a vm for Barbara Cower to call the office back concerning this.  Donzetta Starch, CMA

## 2023-07-12 ENCOUNTER — Encounter: Payer: Managed Care, Other (non HMO) | Admitting: Internal Medicine

## 2023-10-26 ENCOUNTER — Encounter: Payer: Managed Care, Other (non HMO) | Admitting: Internal Medicine

## 2023-11-17 ENCOUNTER — Telehealth: Payer: Self-pay

## 2023-11-17 ENCOUNTER — Other Ambulatory Visit (HOSPITAL_COMMUNITY): Payer: Self-pay

## 2023-11-17 NOTE — Telephone Encounter (Signed)
 Pharmacy Patient Advocate Encounter   Received notification from Onbase that prior authorization for Zepbound  2.5MG /0.5ML pen-injecto is required/requested.   Insurance verification completed.   The patient is insured through [facility] .   Per test claim:

## 2023-11-25 ENCOUNTER — Encounter: Admitting: Internal Medicine

## 2023-12-04 ENCOUNTER — Encounter: Payer: Self-pay | Admitting: Internal Medicine

## 2023-12-12 ENCOUNTER — Ambulatory Visit: Admitting: Internal Medicine

## 2024-01-06 ENCOUNTER — Ambulatory Visit: Admitting: Internal Medicine

## 2024-02-07 ENCOUNTER — Ambulatory Visit: Admitting: Internal Medicine

## 2024-05-22 ENCOUNTER — Ambulatory Visit: Admitting: Internal Medicine
